# Patient Record
Sex: Female | Born: 1970 | Race: Black or African American | Hispanic: No | Marital: Single | Smoking: Never smoker
Health system: Southern US, Community
[De-identification: ages and names within clinical notes are randomized; demographics above are authoritative.]

## PROBLEM LIST (undated history)

## (undated) DIAGNOSIS — Q039 Congenital hydrocephalus, unspecified: Secondary | ICD-10-CM

## (undated) DIAGNOSIS — D649 Anemia, unspecified: Secondary | ICD-10-CM

## (undated) HISTORY — PX: HAND SURGERY: SHX662

## (undated) HISTORY — DX: Congenital hydrocephalus, unspecified: Q03.9

---

## 1998-02-22 HISTORY — PX: MYOMECTOMY: SHX85

## 1998-07-27 ENCOUNTER — Emergency Department (HOSPITAL_COMMUNITY): Admission: EM | Admit: 1998-07-27 | Discharge: 1998-07-27 | Payer: Self-pay | Admitting: Emergency Medicine

## 1999-01-22 ENCOUNTER — Emergency Department (HOSPITAL_COMMUNITY): Admission: EM | Admit: 1999-01-22 | Discharge: 1999-01-22 | Payer: Self-pay | Admitting: *Deleted

## 1999-04-05 ENCOUNTER — Emergency Department (HOSPITAL_COMMUNITY): Admission: EM | Admit: 1999-04-05 | Discharge: 1999-04-05 | Payer: Self-pay | Admitting: Emergency Medicine

## 2000-04-11 ENCOUNTER — Encounter: Payer: Self-pay | Admitting: Obstetrics and Gynecology

## 2000-04-11 ENCOUNTER — Ambulatory Visit (HOSPITAL_COMMUNITY): Admission: RE | Admit: 2000-04-11 | Discharge: 2000-04-11 | Payer: Self-pay | Admitting: Obstetrics and Gynecology

## 2000-10-25 ENCOUNTER — Emergency Department (HOSPITAL_COMMUNITY): Admission: EM | Admit: 2000-10-25 | Discharge: 2000-10-25 | Payer: Self-pay | Admitting: Emergency Medicine

## 2000-10-25 ENCOUNTER — Encounter: Payer: Self-pay | Admitting: Emergency Medicine

## 2001-02-16 ENCOUNTER — Encounter: Payer: Self-pay | Admitting: Emergency Medicine

## 2001-02-16 ENCOUNTER — Emergency Department (HOSPITAL_COMMUNITY): Admission: EM | Admit: 2001-02-16 | Discharge: 2001-02-16 | Payer: Self-pay | Admitting: Emergency Medicine

## 2001-06-07 ENCOUNTER — Emergency Department (HOSPITAL_COMMUNITY): Admission: EM | Admit: 2001-06-07 | Discharge: 2001-06-07 | Payer: Self-pay | Admitting: Emergency Medicine

## 2001-06-07 ENCOUNTER — Encounter: Payer: Self-pay | Admitting: Emergency Medicine

## 2002-01-22 ENCOUNTER — Encounter (INDEPENDENT_AMBULATORY_CARE_PROVIDER_SITE_OTHER): Payer: Self-pay | Admitting: Specialist

## 2002-01-22 ENCOUNTER — Inpatient Hospital Stay (HOSPITAL_COMMUNITY): Admission: RE | Admit: 2002-01-22 | Discharge: 2002-01-25 | Payer: Self-pay

## 2003-08-27 ENCOUNTER — Other Ambulatory Visit: Admission: RE | Admit: 2003-08-27 | Discharge: 2003-08-27 | Payer: Self-pay | Admitting: Internal Medicine

## 2006-06-28 ENCOUNTER — Emergency Department (HOSPITAL_COMMUNITY): Admission: EM | Admit: 2006-06-28 | Discharge: 2006-06-28 | Payer: Self-pay | Admitting: Emergency Medicine

## 2007-06-18 ENCOUNTER — Emergency Department (HOSPITAL_COMMUNITY): Admission: EM | Admit: 2007-06-18 | Discharge: 2007-06-18 | Payer: Self-pay | Admitting: Family Medicine

## 2007-11-02 ENCOUNTER — Other Ambulatory Visit: Admission: RE | Admit: 2007-11-02 | Discharge: 2007-11-02 | Payer: Self-pay | Admitting: Gynecology

## 2007-11-02 ENCOUNTER — Ambulatory Visit: Payer: Self-pay | Admitting: Women's Health

## 2007-12-01 ENCOUNTER — Ambulatory Visit: Payer: Self-pay | Admitting: Women's Health

## 2008-01-11 ENCOUNTER — Emergency Department (HOSPITAL_COMMUNITY): Admission: EM | Admit: 2008-01-11 | Discharge: 2008-01-11 | Payer: Self-pay | Admitting: Emergency Medicine

## 2008-02-19 ENCOUNTER — Ambulatory Visit: Payer: Self-pay | Admitting: Women's Health

## 2009-04-24 ENCOUNTER — Emergency Department (HOSPITAL_COMMUNITY): Admission: EM | Admit: 2009-04-24 | Discharge: 2009-04-24 | Payer: Self-pay | Admitting: Family Medicine

## 2010-07-10 NOTE — Op Note (Signed)
NAME:  Carla Riley, Carla Riley                         ACCOUNT NO.:  [id number]   MEDICAL RECORD NO.:  [id number]                   PATIENT TYPE:  INP   LOCATION:  9325                                 FACILITY:  [facility]   PHYSICIAN:  Petra Kuba, M.D.                 DATE OF BIRTH:  10/19/1970   DATE OF PROCEDURE:  02/06/2002  DATE OF DISCHARGE:  01/25/2002                                 OPERATIVE REPORT   PROCEDURE PERFORMED:  Endoscopic retrograde cholangiopancreatography with  sphincterotomy and stone extraction.   INDICATIONS FOR PROCEDURE:  Probable common bile duct stone in a patient  with gallstones, low grade fever, elevated liver tests.  Consent was signed  after the risks, benefits, methods and options were thoroughly discussed by  both myself and Dr. Magnus Ivan.   MEDICINES USED:  Demerol 80 mg, Versed 8 mg.   DESCRIPTION OF PROCEDURE:  The side viewing diagnostic video duodenoscope  was inserted by indirect vision into the stomach, advanced through a normal  pylorus into the duodenal bulb and a bulbous edematous ampulla was brought  into view.  Using the triple lumen sphinctertome, selective deep cannulation  was obtained on the second attempt.  On some of the early films there  appeared to be a small distal stone.  Also on some of the early films, the  cystic duct was partially filled.  The JAG wire was advanced into the  intrahepatic.  The intrahepatics were filled without abnormality.  We went  ahead and proceeded with the customary medium sized  spincterotomy and the  customary position over the JAG wire until the one half to three quarter  bowed sphinctertome was easily able to be advanced in and out of the duct  and excellent drainage was seen.  While we were exchanging the sphinctertome  for the 8.5 mm balloon, a small stone fell out of the duct followed by milky  white bile.  We went ahead and proceeded with three balloon pullthroughs  over the JAG wire without any  further debris or stones and on two occlusion  cholangiograms, no obvious residual stones were seen.  There were no PD  injections.  The procedure was terminated at this juncture.  The scope  removed.  There was good drainage seen at the end of the procedure.  The  patient tolerated the procedure well.  There were no obvious immediate  complication.   ENDOSCOPIC DIAGNOSES:  1. Bulbous ampulla.  2. No pancreatic duct injections.  3. Status post moderate sphincterotomy and stone extraction with some milky     white bile being seen.  4. Status post three 8.5 mm balloon pullthroughs and negative occlusion     cholangiogram without obvious other abnormality.    PLAN:  Customary post ERCP orders.  Continue antibiotics.  Laparoscopic  cholecystectomy p.r.n. or probably tomorrow.  No aspirin or nonsteroidals  for two weeks based on  post sphincterotomy.                                               Petra Kuba, M.D.    MEM/MEDQ  D:  02/06/2002  T:  02/07/2002  Job:  [id number]   cc:   Abigail Miyamoto, M.D.  [address]  [place]  [place] [address]  Fax: [phone]   Schuyler Amor, M.D.  [address]  [place], [place] [address]  Fax: [phone]

## 2010-07-10 NOTE — Op Note (Signed)
NAME:  Carla Riley, Carla Riley                         ACCOUNT NO.:  [id number]   MEDICAL RECORD NO.:  [id number]                   PATIENT TYPE:  INP   LOCATION:  9325                                 [facility]   PHYSICIAN:  Ronda Fairly. Galen Daft, M.D.              DATE OF BIRTH:  12/22/1970   DATE OF PROCEDURE:  01/22/2002  DATE OF DISCHARGE:                                 OPERATIVE REPORT   PREOPERATIVE DIAGNOSIS:  Symptomatic uterine fibroids with bilateral  hydronephrosis.   POSTOPERATIVE DIAGNOSIS:  1. Symptomatic uterine fibroids with bilateral hydronephrosis.  2. Bilateral tubal disease.   PROCEDURE:  Abdominal myomectomy.   SURGEON:  Ronda Fairly. Galen Daft, M.D.   ASSISTANT:  Rudy Jew. Ashley Royalty, M.D.   ANESTHESIA:  General.   ESTIMATED BLOOD LOSS:  1000 cc.   COMPLICATIONS:  None.   SPECIMENS:  Multiple uterine fibroids.   DESCRIPTION OF PROCEDURE:  The patient received preoperative antibiotics.  Informed consent prior to bringing her to the operating room.  All risks of  the procedure, benefits and alternatives were explained.  She had general  anesthesia and a Pfannenstiel incision after a Betadine prep, Foley catheter  and draping.  The abdomen was entered without problem.  The uterus was  enlarged with multiple fibroids.  They were extensive in both their  dimensions and locations.  The largest fibroids were approximately softball  to large baseball size or small grapefruit and the smaller fibroids were 1-3  cm in size.  They are present on the anterior, posterior, cervical, fundal  and required multiple procedures.  The procedure was performed as follows:  The uterus was injected with Vasopressin 20 units and 30 cc of saline when  necessary for a medical tourniquet.  The incision site was first anteriorly.  The anterior fibroid was identified, removed without difficulty.  Hemostasis  was controlled and that site was closed with a combination of Monocryl and  chromic  suture.  The area was completely hemostatic.  A secondary incision  was then on the right horn behind the right tube and this was the largest  fibroid by far.  This fibroid extended to the lateral surface behind the  right tube.  It was removed with careful sharp and blunt dissection, and the  blood supply was controlled.  The incision area on this one entered the  endometrial cavity.  This was closed with chromic suture followed by closure  of the myometrium with the combination of chromic, interrupted and running  sutures.  The third incision was necessary on the left side behind the tube  and this was in the fundal area.  This allowed for removal of the remaining  fibroids.  Through the second incision, three fibroids were removed.  Through the third incision, several more fibroids were removed.  Because of  the oozing throughout, there was a total estimated blood loss of 1000 cc.  Hemostasis was  controlled with sutures and cautery when necessary.  There  was a remaining fibroid approximately 3 cm in size on the posterior cervix.  This was felt to be both too deep in the pelvis, too close to the blood  supply for the uterine vessels and a risk for removal was felt to outweigh  the benefit of this final fibroid at this time.  Also given the amount of  blood loss and the preoperative anemia, the decision was made not to remove  this final fibroid.  There was a small incidental fibroid about 0.25 cm on  the serosal surface also left in situ because of similar lesions and the  amount of blood loss to date to this time was 1000 cc estimated.  The  patient was hemodynamically stable and did not require a transfusion in the  operating room.  The right tube showed evidence of abnormal fimbria  development with a mid area fimbriated portion and a distal fimbriated  portion.  The right ovary was not identifiable and was either  retroperitoneal or absent.  It was palpable retroperitoneal but not  visible  surgically.  The left ovary was unremarkable.  This tube on the left side  was completely plugged on its end.  The tubes themselves were not  significantly dilated.  The bowel was unremarkable.  The bowel was not  markedly adherent anywhere.  The tubes were adherent to the uterus in  various locations, and these were freed up when necessary.  The areas were  checked for hemostasis and there was complete hemostasis noted.  There was  no active bleeding, and all areas were checked with the uterus backing the  abdominal cavity again, and there was complete hemostasis noted.  The  subfascial tissues were all hemostatic, and the fascia was closed with 0  Vicryl in a running fashion from either side and the subcutaneous tissues  were hemostatic.  The skin was closed with staple closure.  All instrument,  sponge and needle counts were correct throughout the case.  The patient left  the operating room in stable condition.  There were no complications.                                               Ronda Fairly. Galen Daft, M.D.    NJT/MEDQ  D:  01/22/2002  T:  01/23/2002  Job:  [id number]

## 2010-07-10 NOTE — Discharge Summary (Signed)
NAME:  Carla Riley, RORRER                         ACCOUNT NO.:  [id number]   MEDICAL RECORD NO.:  [id number]                   PATIENT TYPE:  INP   LOCATION:  9325                                 [facility]   PHYSICIAN:  Ronda Fairly. Galen Daft, M.D.              DATE OF BIRTH:  12-08-1970   DATE OF ADMISSION:  01/22/2002  DATE OF DISCHARGE:  01/25/2002                                 DISCHARGE SUMMARY   ADMISSION DIAGNOSIS:  Uterine fibroids.   PRINCIPAL DIAGNOSIS:  Uterine fibroids.   SECONDARY DIAGNOSES:  1. Hydronephrosis.  2. Postoperative anemia.   COMPLICATIONS:  None.   CONDITION ON DISCHARGE:  Stable.   FINAL DIAGNOSIS:  Uterine fibroids, status post abdominal myomectomy.   PROCEDURE:  Abdominal myomectomy.   HOSPITAL COURSE:  The patient was admitted on 01/22/02, abdominal myomectomy  was carried out without difficulty.  Total estimated blood loss was 1000 cc.  There were several fibroids removed.  Pathology came back as benign.  A  total weight of fibroid removal was 619 g.  There were 11 masses total  according to the pathology report and consistent with the operative report.  The total area showed that there was some tubal disease present.  The right  tube showed evidence of an abnormal fimbriae development with some mid  portion fimbriated appearing area at the distal fimbriated portion.  The  right ovary was not surgically identified either retroperitoneal or  surgically absent.  The left ovary was unremarkable.  The left tube,  however, had complete closure and phimosis of the end.  There were no  complications noted during the surgical procedure.  The patient left the  operating room in stable condition.  On her first postoperative day she was  doing fairly well.  Her hemoglobin postoperatively was of concern, as she  started out with a preoperative hemoglobin of 10.5.  Her first postoperative  day, her hemoglobin was 6.8 g, and on the day of discharge, on  01/25/02, it  was down to 6 g.  Her white blood cell count was within normal limits, and  the patient was hemodynamically stable with normal blood pressure, no  orthostasis.   DISCHARGE MEDICATIONS:  1. Iron therapy.  2. Percocet for pain.   FOLLOWUP:  In the office in one week for CBC.   DISCHARGE INSTRUCTIONS:  The patient was given full instructions regarding  activity limits, special instructions, including to call if there is any  shortness of breath, chest pain, or dizziness, and activity limits,  medications, wound care, and follow up in the office were stressed with the  patient prior to discharge.   She had full return of bowel function prior to discharge, and she was  ambulatory without difficulty, tolerating a regular diet.  Ronda Fairly. Galen Daft, M.D.    NJT/MEDQ  D:  02/27/2002  T:  02/27/2002  Job:  [id number]   cc:   Thora Lance, M.D.  [address]  [place]  [place] [address]  Fax: [phone]

## 2011-06-09 ENCOUNTER — Emergency Department (HOSPITAL_COMMUNITY): Payer: No Typology Code available for payment source

## 2011-06-09 ENCOUNTER — Encounter (HOSPITAL_COMMUNITY): Payer: Self-pay

## 2011-06-09 ENCOUNTER — Emergency Department (HOSPITAL_COMMUNITY)
Admission: EM | Admit: 2011-06-09 | Discharge: 2011-06-09 | Disposition: A | Discharge status: Home / Self Care | Payer: No Typology Code available for payment source | Attending: Emergency Medicine | Admitting: Emergency Medicine

## 2011-06-09 DIAGNOSIS — S161XXA Strain of muscle, fascia and tendon at neck level, initial encounter: Secondary | ICD-10-CM

## 2011-06-09 DIAGNOSIS — Y92410 Unspecified street and highway as the place of occurrence of the external cause: Secondary | ICD-10-CM | POA: Insufficient documentation

## 2011-06-09 DIAGNOSIS — S139XXA Sprain of joints and ligaments of unspecified parts of neck, initial encounter: Secondary | ICD-10-CM | POA: Insufficient documentation

## 2011-06-09 MED ORDER — OXYCODONE-ACETAMINOPHEN 5-325 MG PO TABS
1.0000 | ORAL_TABLET | Freq: Once | ORAL | Status: AC
  Administered 2011-06-09: 1 via ORAL
  Filled 2011-06-09: qty 1

## 2011-06-09 MED ORDER — HYDROCODONE-ACETAMINOPHEN 5-500 MG PO TABS: 1.0000 | ORAL_TABLET | Freq: Four times a day (QID) | ORAL | Status: AC | PRN

## 2011-06-09 MED ORDER — DIAZEPAM 5 MG PO TABS: 5.0000 mg | ORAL_TABLET | Freq: Three times a day (TID) | ORAL | Status: AC | PRN

## 2011-06-09 MED ORDER — DIAZEPAM 5 MG PO TABS
5.0000 mg | ORAL_TABLET | Freq: Once | ORAL | Status: AC
  Administered 2011-06-09: 5 mg via ORAL
  Filled 2011-06-09: qty 1

## 2011-06-09 MED ORDER — IBUPROFEN 600 MG PO TABS: 600.0000 mg | ORAL_TABLET | Freq: Four times a day (QID) | ORAL | Status: AC | PRN

## 2011-06-09 MED ORDER — IBUPROFEN 800 MG PO TABS
800.0000 mg | ORAL_TABLET | Freq: Once | ORAL | Status: AC
Start: 2011-06-09 — End: 2011-06-09
  Administered 2011-06-09: 800 mg via ORAL
  Filled 2011-06-09: qty 1
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
## 2011-06-09 NOTE — ED Notes (Signed)
PA at bedside.

## 2011-06-09 NOTE — ED Notes (Signed)
Patient transported to X-ray 

## 2011-06-09 NOTE — Discharge Instructions (Signed)
Your x-ray is normal today. I suspect you have a muscular strain in your neck which is causing your pain. Ibuprofen for pain. Vicodin for severe pain, do not drive if taking. Take valium as prescribed as needed for spasms. Try heating pad. Follow up with primary care doctor if pain not improving in 3-5 days.  Cervical Sprain A cervical sprain is an injury in the neck in which the ligaments are stretched or torn. The ligaments are the tissues that hold the bones of the neck (vertebrae) in place.Cervical sprains can range from very mild to very severe. Most cervical sprains get better in 1 to 3 weeks, but it depends on the cause and extent of the injury. Severe cervical sprains can cause the neck vertebrae to be unstable. This can lead to damage of the spinal cord and can result in serious nervous system problems. Your caregiver will determine whether your cervical sprain is mild or severe. CAUSES  Severe cervical sprains may be caused by:  Contact sport injuries (football, rugby, wrestling, hockey, auto racing, gymnastics, diving, martial arts, boxing).   Motor vehicle collisions.   Whiplash injuries. This means the neck is forcefully whipped backward and forward.   Falls.  Mild cervical sprains may be caused by:   Awkward positions, such as cradling a telephone between your ear and shoulder.   Sitting in a chair that does not offer proper support.   Working at a poorly Marketing executive station.   Activities that require looking up or down for long periods of time.  SYMPTOMS   Pain, soreness, stiffness, or a burning sensation in the front, back, or sides of the neck. This discomfort may develop immediately after injury or it may develop slowly and not begin for 24 hours or more after an injury.   Pain or tenderness directly in the middle of the back of the neck.   Shoulder or upper back pain.   Limited ability to move the neck.   Headache.   Dizziness.   Weakness, numbness, or  tingling in the hands or arms.   Muscle spasms.   Difficulty swallowing or chewing.   Tenderness and swelling of the neck.  DIAGNOSIS  Most of the time, your caregiver can diagnose this problem by taking your history and doing a physical exam. Your caregiver will ask about any known problems, such as arthritis in the neck or a previous neck injury. X-rays may be taken to find out if there are any other problems, such as problems with the bones of the neck. However, an X-ray often does not reveal the full extent of a cervical sprain. Other tests such as a computed tomography (CT) scan or magnetic resonance imaging (MRI) may be needed. TREATMENT  Treatment depends on the severity of the cervical sprain. Mild sprains can be treated with rest, keeping the neck in place (immobilization), and pain medicines. Severe cervical sprains need immediate immobilization and an appointment with an orthopedist or neurosurgeon. Several treatment options are available to help with pain, muscle spasms, and other symptoms. Your caregiver may prescribe:  Medicines, such as pain relievers, numbing medicines, or muscle relaxants.   Physical therapy. This can include stretching exercises, strengthening exercises, and posture training. Exercises and improved posture can help stabilize the neck, strengthen muscles, and help stop symptoms from returning.   A neck collar to be worn for short periods of time. Often, these collars are worn for comfort. However, certain collars may be worn to protect the neck and prevent  further worsening of a serious cervical sprain.  HOME CARE INSTRUCTIONS   Put ice on the injured area.   Put ice in a plastic bag.   Place a towel between your skin and the bag.   Leave the ice on for 15 to 20 minutes, 3 to 4 times a day.   Only take over-the-counter or prescription medicines for pain, discomfort, or fever as directed by your caregiver.   Keep all follow-up appointments as directed by  your caregiver.   Keep all physical therapy appointments as directed by your caregiver.   If a neck collar is prescribed, wear it as directed by your caregiver.   Do not drive while wearing a neck collar.   Make any needed adjustments to your work station to promote good posture.   Avoid positions and activities that make your symptoms worse.   Warm up and stretch before being active to help prevent problems.  SEEK MEDICAL CARE IF:   Your pain is not controlled with medicine.   You are unable to decrease your pain medicine over time as planned.   Your activity level is not improving as expected.  SEEK IMMEDIATE MEDICAL CARE IF:   You develop any bleeding, stomach upset, or signs of an allergic reaction to your medicine.   Your symptoms get worse.   You develop new, unexplained symptoms.   You have numbness, tingling, weakness, or paralysis in any part of your body.  MAKE SURE YOU:   Understand these instructions.   Will watch your condition.   Will get help right away if you are not doing well or get worse.  Document Released: 12/06/2006 Document Revised: 01/28/2011 Document Reviewed: 11/11/2010 [facility] Patient Information 2012 [place], [place].
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
## 2011-06-09 NOTE — ED Notes (Signed)
Family at bedside. 

## 2011-06-10 NOTE — ED Provider Notes (Signed)
Medical screening examination/treatment/procedure(s) were performed by non-physician practitioner and as supervising physician I was immediately available for consultation/collaboration.   Lyanne Co, MD 06/10/11 [phone]

## 2011-07-04 ENCOUNTER — Ambulatory Visit (INDEPENDENT_AMBULATORY_CARE_PROVIDER_SITE_OTHER): Payer: BC Managed Care – PPO | Admitting: Family Medicine

## 2011-07-04 VITALS — BP 133/76 | HR 76 | Temp 98.9°F | Resp 16 | Ht 64.5 in | Wt 145.0 lb

## 2011-07-04 DIAGNOSIS — M533 Sacrococcygeal disorders, not elsewhere classified: Secondary | ICD-10-CM

## 2011-07-04 MED ORDER — METHYLPREDNISOLONE 4 MG PO KIT: PACK | ORAL | Status: AC
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
## 2011-07-04 NOTE — Patient Instructions (Signed)
Return Wednesday for recheck.

## 2011-08-20 ENCOUNTER — Telehealth: Payer: Self-pay | Admitting: *Deleted

## 2011-08-20 NOTE — Telephone Encounter (Signed)
Pt called requesting to speak with nancy about uterine fibroids, pt last seen in 2009, spoke with pt and informed office visit would be best.

## 2011-08-23 ENCOUNTER — Encounter: Payer: Self-pay | Admitting: Women's Health

## 2011-08-23 ENCOUNTER — Ambulatory Visit (INDEPENDENT_AMBULATORY_CARE_PROVIDER_SITE_OTHER): Payer: BC Managed Care – PPO | Admitting: Women's Health

## 2011-08-23 ENCOUNTER — Other Ambulatory Visit (HOSPITAL_COMMUNITY)
Admission: RE | Admit: 2011-08-23 | Discharge: 2011-08-23 | Disposition: A | Discharge status: Home / Self Care | Payer: BC Managed Care – PPO | Source: Ambulatory Visit | Attending: Obstetrics and Gynecology | Admitting: Obstetrics and Gynecology

## 2011-08-23 VITALS — BP 130/80 | Ht 63.5 in | Wt 143.0 lb

## 2011-08-23 DIAGNOSIS — Z01419 Encounter for gynecological examination (general) (routine) without abnormal findings: Secondary | ICD-10-CM | POA: Insufficient documentation

## 2011-08-23 DIAGNOSIS — D259 Leiomyoma of uterus, unspecified: Secondary | ICD-10-CM

## 2011-08-23 DIAGNOSIS — D219 Benign neoplasm of connective and other soft tissue, unspecified: Secondary | ICD-10-CM | POA: Insufficient documentation

## 2011-08-23 DIAGNOSIS — Z0271 Encounter for disability determination: Secondary | ICD-10-CM

## 2011-08-23 DIAGNOSIS — Z833 Family history of diabetes mellitus: Secondary | ICD-10-CM

## 2011-08-23 DIAGNOSIS — Q039 Congenital hydrocephalus, unspecified: Secondary | ICD-10-CM | POA: Insufficient documentation

## 2011-08-23 LAB — CBC WITH DIFFERENTIAL/PLATELET
Basophils Absolute: 0 10*3/uL (ref 0.0–0.1)
Eosinophils Absolute: 0 10*3/uL (ref 0.0–0.7)
Lymphocytes Relative: 47 % — ABNORMAL HIGH (ref 12–46)
Lymphs Abs: 3.1 10*3/uL (ref 0.7–4.0)
Neutrophils Relative %: 44 % (ref 43–77)
Platelets: 314 10*3/uL (ref 150–400)
RBC: 4.14 MIL/uL (ref 3.87–5.11)
RDW: 13.5 % (ref 11.5–15.5)
WBC: 6.5 10*3/uL (ref 4.0–10.5)

## 2011-08-23 NOTE — Patient Instructions (Addendum)
Fibroids You have been diagnosed as having a fibroid. Fibroids are smooth muscle lumps (tumors) which can occur any place in a woman's body. They are usually in the womb (uterus). The most common problem (symptom) of fibroids is bleeding. Over time this may cause low red blood cells (anemia). Other symptoms include feelings of pressure and pain in the pelvis. The diagnosis (learning what is wrong) of fibroids is made by physical exam. Sometimes tests such as an ultrasound are used. This is helpful when fibroids are felt around the ovaries and to look for tumors. TREATMENT   Most fibroids do not need surgical or medical treatment. Sometimes a tissue sample (biopsy) of the lining of the uterus is done to rule out cancer. If there is no cancer and only a small amount of bleeding, the problem can be watched.   Hormonal treatment can improve the problem.   When surgery is needed, it can consist of removing the fibroid. Vaginal birth may not be possible after the removal of fibroids. This depends on where they are and the extent of surgery. When pregnancy occurs with fibroids it is usually normal.   Your caregiver can help decide which treatments are best for you.  HOME CARE INSTRUCTIONS   Do not use aspirin as this may increase bleeding problems.   If your periods (menses) are heavy, record the number of pads or tampons used per month. Bring this information to your caregiver. This can help them determine the best treatment for you.  SEEK IMMEDIATE MEDICAL CARE IF:  You have pelvic pain or cramps not controlled with medications, or experience a sudden increase in pain.   You have an increase of pelvic bleeding between and during menses.   You feel lightheaded or have fainting spells.   You develop worsening belly (abdominal) pain.  Document Released: 02/06/2000 Document Revised: 01/28/2011 Document Reviewed: 09/28/2007 [facility] Patient Information 2012 [place], [place].Health Maintenance,  Females A healthy lifestyle and preventative care can promote health and wellness.  Maintain regular health, dental, and eye exams.   Eat a healthy diet. Foods like vegetables, fruits, whole grains, low-fat dairy products, and lean protein foods contain the nutrients you need without too many calories. Decrease your intake of foods high in solid fats, added sugars, and salt. Get information about a proper diet from your caregiver, if necessary.   Regular physical exercise is one of the most important things you can do for your health. Most adults should get at least 150 minutes of moderate-intensity exercise (any activity that increases your heart rate and causes you to sweat) each week. In addition, most adults need muscle-strengthening exercises on 2 or more days a week.    Maintain a healthy weight. The body mass index (BMI) is a screening tool to identify possible weight problems. It provides an estimate of body fat based on height and weight. Your caregiver can help determine your BMI, and can help you achieve or maintain a healthy weight. For adults 20 years and older:   A BMI below 18.5 is considered underweight.   A BMI of 18.5 to 24.9 is normal.   A BMI of 25 to 29.9 is considered overweight.   A BMI of 30 and above is considered obese.   Maintain normal blood lipids and cholesterol by exercising and minimizing your intake of saturated fat. Eat a balanced diet with plenty of fruits and vegetables. Blood tests for lipids and cholesterol should begin at age 57 and be repeated every 5 years.  If your lipid or cholesterol levels are high, you are over 50, or you are a high risk for heart disease, you may need your cholesterol levels checked more frequently.Ongoing high lipid and cholesterol levels should be treated with medicines if diet and exercise are not effective.   If you smoke, find out from your caregiver how to quit. If you do not use tobacco, do not start.   If you are  pregnant, do not drink alcohol. If you are breastfeeding, be very cautious about drinking alcohol. If you are not pregnant and choose to drink alcohol, do not exceed 1 drink per day. One drink is considered to be 12 ounces (355 mL) of beer, 5 ounces (148 mL) of wine, or 1.5 ounces (44 mL) of liquor.   Avoid use of street drugs. Do not share needles with anyone. Ask for help if you need support or instructions about stopping the use of drugs.   High blood pressure causes heart disease and increases the risk of stroke. Blood pressure should be checked at least every 1 to 2 years. Ongoing high blood pressure should be treated with medicines, if weight loss and exercise are not effective.   If you are 48 to 41 years old, ask your caregiver if you should take aspirin to prevent strokes.   Diabetes screening involves taking a blood sample to check your fasting blood sugar level. This should be done once every 3 years, after age 25, if you are within normal weight and without risk factors for diabetes. Testing should be considered at a younger age or be carried out more frequently if you are overweight and have at least 1 risk factor for diabetes.   Breast cancer screening is essential preventative care for women. You should practice "breast self-awareness." This means understanding the normal appearance and feel of your breasts and may include breast self-examination. Any changes detected, no matter how small, should be reported to a caregiver. Women in their 70s and 30s should have a clinical breast exam (CBE) by a caregiver as part of a regular health exam every 1 to 3 years. After age 66, women should have a CBE every year. Starting at age 52, women should consider having a mammogram (breast X-ray) every year. Women who have a family history of breast cancer should talk to their caregiver about genetic screening. Women at a high risk of breast cancer should talk to their caregiver about having an MRI and a  mammogram every year.   The Pap test is a screening test for cervical cancer. Women should have a Pap test starting at age 4. Between ages 54 and 65, Pap tests should be repeated every 2 years. Beginning at age 80, you should have a Pap test every 3 years as long as the past 3 Pap tests have been normal. If you had a hysterectomy for a problem that was not cancer or a condition that could lead to cancer, then you no longer need Pap tests. If you are between ages 66 and 6, and you have had normal Pap tests going back 10 years, you no longer need Pap tests. If you have had past treatment for cervical cancer or a condition that could lead to cancer, you need Pap tests and screening for cancer for at least 20 years after your treatment. If Pap tests have been discontinued, risk factors (such as a new sexual partner) need to be reassessed to determine if screening should be resumed. Some women have medical  problems that increase the chance of getting cervical cancer. In these cases, your caregiver may recommend more frequent screening and Pap tests.   The human papillomavirus (HPV) test is an additional test that may be used for cervical cancer screening. The HPV test looks for the virus that can cause the cell changes on the cervix. The cells collected during the Pap test can be tested for HPV. The HPV test could be used to screen women aged 34 years and older, and should be used in women of any age who have unclear Pap test results. After the age of 54, women should have HPV testing at the same frequency as a Pap test.   Colorectal cancer can be detected and often prevented. Most routine colorectal cancer screening begins at the age of 57 and continues through age 46. However, your caregiver may recommend screening at an earlier age if you have risk factors for colon cancer. On a yearly basis, your caregiver may provide home test kits to check for hidden blood in the stool. Use of a small camera at the end of a  tube, to directly examine the colon (sigmoidoscopy or colonoscopy), can detect the earliest forms of colorectal cancer. Talk to your caregiver about this at age 75, when routine screening begins. Direct examination of the colon should be repeated every 5 to 10 years through age 51, unless early forms of pre-cancerous polyps or small growths are found.   Hepatitis C blood testing is recommended for all people born from [birth year] through 1965 and any individual with known risks for hepatitis C.   Practice safe sex. Use condoms and avoid high-risk sexual practices to reduce the spread of sexually transmitted infections (STIs). Sexually active women aged 42 and younger should be checked for Chlamydia, which is a common sexually transmitted infection. Older women with new or multiple partners should also be tested for Chlamydia. Testing for other STIs is recommended if you are sexually active and at increased risk.   Osteoporosis is a disease in which the bones lose minerals and strength with aging. This can result in serious bone fractures. The risk of osteoporosis can be identified using a bone density scan. Women ages 79 and over and women at risk for fractures or osteoporosis should discuss screening with their caregivers. Ask your caregiver whether you should be taking a calcium supplement or vitamin D to reduce the rate of osteoporosis.   Menopause can be associated with physical symptoms and risks. Hormone replacement therapy is available to decrease symptoms and risks. You should talk to your caregiver about whether hormone replacement therapy is right for you.   Use sunscreen with a sun protection factor (SPF) of 30 or greater. Apply sunscreen liberally and repeatedly throughout the day. You should seek shade when your shadow is shorter than you. Protect yourself by wearing long sleeves, pants, a wide-brimmed hat, and sunglasses year round, whenever you are outdoors.   Notify your caregiver of new moles  or changes in moles, especially if there is a change in shape or color. Also notify your caregiver if a mole is larger than the size of a pencil eraser.   Stay current with your immunizations.  Document Released: 08/24/2010 Document Revised: 01/28/2011 Document Reviewed: 08/24/2010 [facility] Patient Information 2012 [place], [place].

## 2011-08-23 NOTE — Progress Notes (Signed)
Carla Riley 1970-03-03 [id number]    History:    The patient presents for annual exam. Monthly cycles/5-7 days/no contraception, desires pregnancy, infertility for several years. Same partner X 2 years, reports negative STD screening in partner. No mammogram. Last Pap 2009-hx normal Paps. Hx uterine fibroids with myomectomy in 2000. [place] 2009: 5 uterine fibroids, largest 52X40 mm. Reports urinary frequency for several years, denies dysuria or hematuria.   Past medical history, past surgical history, family history and social history were all reviewed and documented in the EPIC chart. Works at [employer]. Job loss 2010-2012, unable to follow up on fibroids in 2009 due to finances. Boyfriend-3 children, 18.15, and 7 yo. Rubella immune. Hx hydrocephalus as infant with shunt.   ROS:  A  ROS was performed and pertinent positives and negatives are included in the history.  Exam:  Filed Vitals:   08/23/11 1401  BP: 130/80    General appearance:  Normal Head/Neck:  Normal, without cervical or supraclavicular adenopathy. Thyroid:  Symmetrical, normal in size, without palpable masses or nodularity. Respiratory  Effort:  Normal  Auscultation:  Clear without wheezing or rhonchi Cardiovascular  Auscultation:  Regular rate, without rubs, murmurs or gallops  Edema/varicosities:  Not grossly evident Abdominal  Soft,nontender, without masses, guarding or rebound.  Liver/spleen:  No organomegaly noted  Hernia:  None appreciated  Skin  Inspection:  Grossly normal  Palpation:  Grossly normal Neurologic/psychiatric  Orientation:  Normal with appropriate conversation.  Mood/affect:  Normal  Genitourinary    Breasts: Examined lying and sitting.     Right: Without masses, retractions, discharge or axillary adenopathy.     Left: Without masses, retractions, discharge or axillary adenopathy.   Inguinal/mons:  Normal without inguinal adenopathy  External genitalia:  Normal  BUS/Urethra/Skene's  glands:  Normal  Bladder:  Normal  Vagina:  Normal  Cervix:  Normal  Uterus:  18-20 week in size, shape and contour.  Midline and mobile.  Adnexa/parametria:     Rt: Without masses or tenderness.   Lt: Without masses or tenderness.  Anus and perineum: Normal  Digital rectal exam: Normal sphincter tone without palpated masses or tenderness  Assessment/Plan:  41 y.o. DBF G0 for annual exam with complaints of uterine fibroids and urinary frequency.  Enlarged uterus/ fibroids Primary Infertility  Plan: CBC, U/A, Glu. Pap, new screening guidelines reviewed. Sonohysterogram ordered to evaluate uterine fibroids, instructed to schedule with Dr. Lily Peer after next cycle. Discussed treatment options for uterine fibroids, patient desiring pregnancy. SBE's, Ca rich diet, daily MVI, and daily exercise encouraged. Mammogram overdue-instructed to schedule. Discussed fertility awareness for conception.  Harrington Challenger [facility], 2:56 PM 08/23/2011

## 2011-08-24 LAB — URINALYSIS W MICROSCOPIC + REFLEX CULTURE
Casts: NONE SEEN
Glucose, UA: NEGATIVE mg/dL
Hgb urine dipstick: NEGATIVE
Leukocytes, UA: NEGATIVE
pH: 6 (ref 5.0–8.0)

## 2011-08-24 LAB — GLUCOSE, RANDOM: Glucose, Bld: 87 mg/dL (ref 70–99)

## 2011-09-22 ENCOUNTER — Other Ambulatory Visit: Payer: Self-pay | Admitting: Gynecology

## 2011-09-22 ENCOUNTER — Other Ambulatory Visit: Payer: Self-pay | Admitting: Women's Health

## 2011-09-22 ENCOUNTER — Ambulatory Visit (INDEPENDENT_AMBULATORY_CARE_PROVIDER_SITE_OTHER): Payer: BC Managed Care – PPO

## 2011-09-22 ENCOUNTER — Ambulatory Visit (INDEPENDENT_AMBULATORY_CARE_PROVIDER_SITE_OTHER): Payer: BC Managed Care – PPO | Admitting: Gynecology

## 2011-09-22 ENCOUNTER — Encounter: Payer: Self-pay | Admitting: Gynecology

## 2011-09-22 DIAGNOSIS — D219 Benign neoplasm of connective and other soft tissue, unspecified: Secondary | ICD-10-CM

## 2011-09-22 DIAGNOSIS — N946 Dysmenorrhea, unspecified: Secondary | ICD-10-CM | POA: Insufficient documentation

## 2011-09-22 DIAGNOSIS — D259 Leiomyoma of uterus, unspecified: Secondary | ICD-10-CM

## 2011-09-22 DIAGNOSIS — N949 Unspecified condition associated with female genital organs and menstrual cycle: Secondary | ICD-10-CM

## 2011-09-22 DIAGNOSIS — N920 Excessive and frequent menstruation with regular cycle: Secondary | ICD-10-CM

## 2011-09-22 NOTE — Progress Notes (Addendum)
Patient is a 41 year old gravida 0 who presented to the office today for sonohysterogram as a result of her labor myomas uteri and menorrhagia. Patient stated that several years ago she had abdominal myomectomy. She's not sure she wants to maintain her fertility further we'll proceed with definitive surgery. The ultrasound today demonstrated the following:  Uterus is 14.7 cm x 9.7 cm x 10.1 cm endometrial stripe 5.5 mm. Patient had 4 fibroids the largest one measured 56 x 44 mm. Ovaries appeared to be normal.  Sonohysterogram demonstrated no intracavitary defect  Assessment/plan: Patient with symptomatic leiomyomatous uteri recent CBC with hemoglobin 12.4 hematocrit 36.5 platelet count her and 314,000. Literature information was provided on uterine fibroids as well as on hysterectomy. She will discuss with her partner she was to proceed with another abdominal myomectomy versus definitive surgery such as hysterectomy. The risks benefits and pros and cons of both procedure were discussed. Discussed possibly having to donate one or 2 units autologous blood and she decided to proceed with abdominal myomectomy. We'll need to pull her operative note from her previous surgery since patient stated that she was in the hospital for week because of infection. She will contact us and we'll schedule accordingly and I will need to see her for full preoperative examination.

## 2011-09-22 NOTE — Patient Instructions (Addendum)
Hysterectomy Information  A hysterectomy is a procedure where your uterus is surgically removed. It will no longer be possible to have menstrual periods or to become pregnant. The tubes and ovaries can be removed (bilateral salpingo-oopherectomy) during this surgery as well.  REASONS FOR A HYSTERECTOMY  Persistent, abnormal bleeding.   Lasting (chronic) pelvic pain or infection.   The lining of the uterus (endometrium) starts growing outside the uterus (endometriosis).   The endometrium starts growing in the muscle of the uterus (adenomyosis).   The uterus falls down into the vagina (pelvic organ prolapse).   Symptomatic uterine fibroids.   Precancerous cells.   Cervical cancer or uterine cancer.  TYPES OF HYSTERECTOMIES  Supracervical hysterectomy. This type removes the top part of the uterus, but not the cervix.   Total hysterectomy. This type removes the uterus and cervix.   Radical hysterectomy. This type removes the uterus, cervix, and the fibrous tissue that holds the uterus in place in the pelvis (parametrium).  WAYS A HYSTERECTOMY CAN BE PERFORMED  Abdominal hysterectomy. A large surgical cut (incision) is made in the abdomen. The uterus is removed through this incision.   Vaginal hysterectomy. An incision is made in the vagina. The uterus is removed through this incision. There are no abdominal incisions.   Conventional laparoscopic hysterectomy. A thin, lighted tube with a camera (laparoscope) is inserted into 3 or 4 small incisions in the abdomen. The uterus is cut into small pieces. The small pieces are removed through the incisions, or they are removed through the vagina.   Laparoscopic assisted vaginal hysterectomy (LAVH). Three or four small incisions are made in the abdomen. Part of the surgery is performed laparoscopically and part vaginally. The uterus is removed through the vagina.   Robot-assisted laparoscopic hysterectomy. A laparoscope is inserted into 3 or 4  small incisions in the abdomen. A computer-controlled device is used to give the surgeon a 3D image. This allows for more precise movements of surgical instruments. The uterus is cut into small pieces and removed through the incisions or removed through the vagina.  RISKS OF HYSTERECTOMY   Bleeding and risk of blood transfusion. Tell your caregiver if you do not want to receive any blood products.   Blood clots in the legs or lung.   Infection.   Injury to surrounding organs.   Anesthesia problems or side effects.   Conversion to an abdominal hysterectomy.  WHAT TO EXPECT AFTER A HYSTERECTOMY  You will be given pain medicine.   You will need to have someone with you for the first 3 to 5 days after you go home.   You will need to follow up with your surgeon in 2 to 4 weeks after surgery to evaluate your progress.   You may have early menopause symptoms like hot flashes, night sweats, and insomnia.   If you had a hysterectomy for a problem that was not a cancer or a condition that could lead to cancer, then you no longer need Pap tests. However, even if you no longer need a Pap test, a regular exam is a good idea to make sure no other problems are starting.  Document Released: 08/04/2000 Document Revised: 01/28/2011 Document Reviewed: 09/19/2010 [facility] Patient Information 2012 [place], [place].  Fibroids You have been diagnosed as having a fibroid. Fibroids are smooth muscle lumps (tumors) which can occur any place in a woman's body. They are usually in the womb (uterus). The most common problem (symptom) of fibroids is bleeding. Over time this  may cause low red blood cells (anemia). Other symptoms include feelings of pressure and pain in the pelvis. The diagnosis (learning what is wrong) of fibroids is made by physical exam. Sometimes tests such as an ultrasound are used. This is helpful when fibroids are felt around the ovaries and to look for tumors. TREATMENT   Most fibroids do not  need surgical or medical treatment. Sometimes a tissue sample (biopsy) of the lining of the uterus is done to rule out cancer. If there is no cancer and only a small amount of bleeding, the problem can be watched.   Hormonal treatment can improve the problem.   When surgery is needed, it can consist of removing the fibroid. Vaginal birth may not be possible after the removal of fibroids. This depends on where they are and the extent of surgery. When pregnancy occurs with fibroids it is usually normal.   Your caregiver can help decide which treatments are best for you.  HOME CARE INSTRUCTIONS   Do not use aspirin as this may increase bleeding problems.   If your periods (menses) are heavy, record the number of pads or tampons used per month. Bring this information to your caregiver. This can help them determine the best treatment for you.  SEEK IMMEDIATE MEDICAL CARE IF:  You have pelvic pain or cramps not controlled with medications, or experience a sudden increase in pain.   You have an increase of pelvic bleeding between and during menses.   You feel lightheaded or have fainting spells.   You develop worsening belly (abdominal) pain.  Document Released: 02/06/2000 Document Revised: 01/28/2011 Document Reviewed: 09/28/2007 [facility] Patient Information 2012 [place], [place].

## 2011-09-24 ENCOUNTER — Telehealth: Payer: Self-pay | Admitting: *Deleted

## 2011-09-24 NOTE — Telephone Encounter (Signed)
Telephone call, reviewed ultrasound with numerous fibroids largest being about 6 cm. Discussed myomectomy versus hysterectomy. Has not had a pregnancy. Reviewed decreased fertility rates if the increased risks for health after age 41. Will discuss further with boyfriend make a decision whether to proceed with myomectomy versus hysterectomy and will call back.

## 2011-09-24 NOTE — Telephone Encounter (Signed)
Pt called requesting if you could call her @ [phone], she would like to speak with you about her procedure and SHGM. Please advise

## 2011-09-28 ENCOUNTER — Telehealth: Payer: Self-pay | Admitting: *Deleted

## 2011-09-28 NOTE — Telephone Encounter (Signed)
Patient called and wants to speak to you only.  Carla Riley it had to do with "what you all talked about before".  Please call patient.

## 2011-09-29 NOTE — Telephone Encounter (Signed)
Telephone call, states would like to schedule hysterectomy for numerous fibroids, had been contemplating myomectomy vs hysterectomy. Has discussed with Dr. Lily Peer, options were reviewed, is ready to proceed.  Carla Riley please schedule vaginal hysterectomy with Dr. Lily Peer, office visit prior. Anytime is fine, patient says sooner the better.

## 2011-09-29 NOTE — Telephone Encounter (Signed)
Dr. Glenetta Hew I would like to get surgery instructions from you...ie. Procedure, OR time needed, special equipment, etc. Thanks!

## 2011-09-29 NOTE — Telephone Encounter (Signed)
Carla Riley I need to see this patient to do a full exam on her once again under she was here July 31. Because of the size of the uterus we may want to proceed with total laparoscopic hysterectomy but possible abdominal hysterectomy with ovarian conservation. If we started 7:30 in the morning my assistant should be back by 10 AM and I will be back by 10:30 AM.

## 2011-09-30 ENCOUNTER — Other Ambulatory Visit: Payer: Self-pay | Admitting: Gynecology

## 2011-09-30 NOTE — Telephone Encounter (Signed)
Patient will be seen in office on 10/04/11 per Olegario Messier

## 2011-10-04 ENCOUNTER — Encounter: Payer: Self-pay | Admitting: Gynecology

## 2011-10-04 ENCOUNTER — Ambulatory Visit (INDEPENDENT_AMBULATORY_CARE_PROVIDER_SITE_OTHER): Payer: BC Managed Care – PPO | Admitting: Gynecology

## 2011-10-04 VITALS — BP 110/70

## 2011-10-04 DIAGNOSIS — N949 Unspecified condition associated with female genital organs and menstrual cycle: Secondary | ICD-10-CM

## 2011-10-04 DIAGNOSIS — D219 Benign neoplasm of connective and other soft tissue, unspecified: Secondary | ICD-10-CM

## 2011-10-04 DIAGNOSIS — D259 Leiomyoma of uterus, unspecified: Secondary | ICD-10-CM

## 2011-10-04 DIAGNOSIS — R102 Pelvic and perineal pain: Secondary | ICD-10-CM

## 2011-10-04 DIAGNOSIS — Z01818 Encounter for other preprocedural examination: Secondary | ICD-10-CM

## 2011-10-04 DIAGNOSIS — N946 Dysmenorrhea, unspecified: Secondary | ICD-10-CM

## 2011-10-04 DIAGNOSIS — N920 Excessive and frequent menstruation with regular cycle: Secondary | ICD-10-CM

## 2011-10-04 NOTE — Progress Notes (Addendum)
Carla Riley is an 41 y.o. female gravida 0 para 0 that presented to the office today for preoperative consultation. Patient with long-standing history of leiomyomatous uteri. Patient had stated several years ago she had an abdominal myomectomy. She suffers from dysmenorrhea and menorrhagia and has decided to proceed with definitive surgery such as with hysterectomy with ovarian conservation. She is sure that fertility is no longer an issue for her but her quality of life is. Patient had an ultrasound done here in the office on 09/22/2011 with the following result:  Uterus is 14.7 cm x 9.7 cm x 10.1 cm endometrial stripe 5.5 mm. Patient had 4 fibroids the largest one measured 56 x 44 mm. Ovaries appeared to be normal.  Sonohysterogram demonstrated no intracavitary defect  Patient's recent hemoglobin was 12.4 hematocrit was 36.5 with a platelet count 314,000. Patient had a normal Pap smear this year.  History of hydrocephalus as a child and had a shunt placed  Pertinent Gynecological History: Menses: Menorrhagia and dysmenorrhea Bleeding: Dysmenorrhea and menorrhagia Contraception: condoms DES exposure: denies Blood transfusions: none Sexually transmitted diseases: no past history Previous GYN Procedures: Abdominal myomectomy  Last mammogram: No prior study Date: No prior study Last pap: normal Date: 2013 OB History: G 0, P 0   Menstrual History: Menarche age: 12 Patient's last menstrual period was 09/23/2011.    Past Medical History  Diagnosis Date  . Fibroids   . Hydrocephalus in newborn     Past Surgical History  Procedure Date  . Hand surgery   . Myomectomy 2000    Family History  Problem Relation Age of Onset  . Hypertension Father   . Hypertension Maternal Grandmother     Social History:  reports that she has never smoked. She has never used smokeless tobacco. She reports that she drinks alcohol. She reports that she does not use illicit drugs.  Allergies: No  Known Allergies   (Not in a hospital admission)  REVIEW OF SYSTEMS: A ROS was performed and pertinent positives and negatives are included in the history.  GENERAL: No fevers or chills. HEENT: No change in vision, no earache, sore throat or sinus congestion. NECK: No pain or stiffness. CARDIOVASCULAR: No chest pain or pressure. No palpitations. PULMONARY: No shortness of breath, cough or wheeze. GASTROINTESTINAL: No abdominal pain, nausea, vomiting or diarrhea, melena or bright red blood per rectum. GENITOURINARY: No urinary frequency, urgency, hesitancy or dysuria. MUSCULOSKELETAL: No joint or muscle pain, no back pain, no recent trauma. DERMATOLOGIC: No rash, no itching, no lesions. ENDOCRINE: No polyuria, polydipsia, no heat or cold intolerance. No recent change in weight. HEMATOLOGICAL: No anemia or easy bruising or bleeding. NEUROLOGIC: No headache, seizures, numbness, tingling or weakness. PSYCHIATRIC: No depression, no loss of interest in normal activity or change in sleep pattern.     Blood pressure 110/70, last menstrual period 09/23/2011.  Physical Exam:  HEENT:unremarkable Neck:Supple, midline, no thyroid megaly, no carotid bruits Lungs:  Clear to auscultation no rhonchi's or wheezes Heart:Regular rate and rhythm, no murmurs or gallops Breast Exam: Symmetrical with no palpable masses or tenderness Abdomen: Soft nontender no rebound uterine fundus felt up to 3 finger breaths above the symphysis pubis  Pelvic:BUS within normal limits  Vagina: No lesions or discharge Cervix: No lesions or discharge Uterus: 14 week size Adnexa: Difficult to assess adnexa on bimanual exam see ultrasound report above Extremities: No cords, no edema Rectal: Unremarkable  No results found for this or any previous visit (from the past 24 hour(s)).    No results found.  Assessment/Plan: Patient with 14 week size symptomatic leiomyomatous uteri contributing to pelvic pain dysmenorrhea and menorrhagia.  Patient has decided that she would like to proceed with definitive surgery and bypass her options for future fertility. Literature information had previously been provided on total laparoscopic hysterectomy as well as of fibroid uterus. We discussed about proceeding with a total laparoscopic hysterectomy and ovarian conservation. We discussed that in the event of technical difficulty and the operations not being able to be completed laparoscopically that an open laparotomy technique may need to be utilized which may require her to stay in the hospital for 2 days. She fully understands that she will not be able having children in the future. She fully accepts this free willingly. The following were the risk of discussed:                        Patient was counseled as to the risk of surgery to include the following:  1. Infection (prohylactic antibiotics will be administered)  2. DVT/Pulmonary Embolism (prophylactic pneumo compression stockings will be used)  3.Trauma to internal organs requiring additional surgical procedure to repair any injury to     Internal organs requiring perhaps additional hospitalization days.  4.Hemmorhage requiring transfusion and blood products which carry risks such as             anaphylactic reaction, hepatitis and AIDS  Patient had received literature information on the procedure scheduled and all her questions were answered  and accepts all risk.  FERNANDEZ,JUAN HMD5:19 PMTD@   FERNANDEZ,JUAN H 10/04/2011, 5:07 PM   

## 2011-10-04 NOTE — Patient Instructions (Addendum)
Total Laparoscopic Hysterectomy A total laparoscopic hysterectomy is a minimally invasive surgery to remove your uterus and cervix. This surgery is performed by making several small cuts (incisions) in your abdomen. It can also be done with a thin, lighted tube (laparoscope) inserted into 2 small incisions in the lower abdomen. Your fallopian tubes and ovaries can be removed (bilateral salpingo-oopherectomy) during this surgery as well.If a total laparoscopic hysterectomy is started and it is not safe to continue, the laparoscopic surgery will be converted to an open abdominal surgery. You will not have menstrual periods or be able to get pregnant after having this surgery. If a bilateral salpingo-oopherectomy was performed before menopause, you will go through a sudden (abrupt) menopause. This can be helped with hormone medicines. Benefits of minimally invasive surgery include:  Less pain.   Less risk of blood loss.   Less risk of infection.   Quicker return to normal activities.   Usually a 1 night stay in the hospital.   Overall patient satisfaction.  LET YOUR CAREGIVER KNOW ABOUT:  Any history of abnormal Pap tests.   Allergies to food or medicine.   Medicines taken, including vitamins, herbs, eyedrops, over-the-counter medicines, and creams.   Use of steroids (by mouth or creams).   Previous problems with anesthetics or numbing medicines.   History of bleeding problems or blood clots.   Previous surgery.   Other health problems, including diabetes and kidney problems.   Desire for future fertility.   Any infections or colds you may have developed.   Symptoms of irregular or heavy periods, weight loss, or urinary or bowel changes.  RISKS AND COMPLICATIONS   Bleeding.   Blood clots in the legs or lung.   Infection.   Injury to surrounding organs.   Problems with anesthesia.   Early menopause symptoms (hot flashes, night sweats, insomnia).   Risk of conversion  to an open abdominal incision.  BEFORE THE PROCEDURE  Ask your caregiver about changing or stopping your regular medicines.   Do not take aspirin or blood thinners (anticoagulants) for 1 week before the surgery, or as told by your caregiver.   Do not eat or drink anything for 8 hours before the surgery, or as told by your caregiver.   Quit smoking if you smoke.   Arrange for a ride home after surgery and for someone to help you at home during recovery.  PROCEDURE   You will be given antibiotic medicine.   An intravenous (IV) line will be placed in your arm. You will be given medicine to make you sleep (general anesthetic).   A gas (carbon dioxide) will be used to inflate your abdomen. This will allow your surgeon to look inside your abdomen, perform your surgery, and treat any other problems found if necessary.   Three or four small incisions (often less than  inch) will be made in your abdomen. One of these incisions will be made in the area of your belly button (navel). The laparoscope will be inserted into the incision. Your surgeon will look through the laparoscope while doing your procedure.   Other surgical instruments will be inserted through the other incisions.   The uterus may be removed through the vagina or cut into small pieces and removed through the small incisions.   Your incisions will be closed.  AFTER THE PROCEDURE  The gas will be released from inside your abdomen.   You will be taken to the recovery area where a nurse will watch and   check your progress. Once you are awake, stable, and taking fluids well, without other problems, you will return to your room or be allowed to go home.   There is usually minimal discomfort following the surgery because the incisions are so small.   You will be given pain medicine while you are in the hospital and for when you go home.   Try to have someone with you the first 3 to 5 days after you go home.   Follow up with  your surgeon in 2 to 4 weeks after surgery to evaluate your progress.  Document Released: 12/06/2006 Document Revised: 01/28/2011 Document Reviewed: 09/25/2010 [employer] Patient Information 2012 [employer]. 

## 2011-10-06 ENCOUNTER — Telehealth: Payer: Self-pay | Admitting: Gynecology

## 2011-10-06 NOTE — Telephone Encounter (Signed)
Patient called with several surgery related questions.  1. What if she is on her period at time of surgery. It is irregular and hard to predict.  I told her this would not present a problem.  2.  She said Dr. Glenetta Hew explained three little incisions with TLH. She wondered how he will get the fibroid out through the tiny incision.  I explained to her that he uses equipment that allows him to cut the fibroid up into small pieces and remove it through the scope.  Patient understood.  We discussed if she needs assistance with disability/FMLA forms for work that she will let me know.

## 2011-10-18 ENCOUNTER — Encounter (HOSPITAL_COMMUNITY): Payer: Self-pay | Admitting: Pharmacist

## 2011-10-28 ENCOUNTER — Encounter (HOSPITAL_COMMUNITY): Payer: Self-pay

## 2011-10-28 ENCOUNTER — Encounter (HOSPITAL_COMMUNITY)
Admission: RE | Admit: 2011-10-28 | Discharge: 2011-10-28 | Disposition: A | Discharge status: Home / Self Care | Payer: BC Managed Care – PPO | Source: Ambulatory Visit | Attending: Gynecology | Admitting: Gynecology

## 2011-10-28 HISTORY — DX: Anemia, unspecified: D64.9

## 2011-10-28 LAB — URINALYSIS, ROUTINE W REFLEX MICROSCOPIC
Ketones, ur: NEGATIVE mg/dL
Leukocytes, UA: NEGATIVE
Nitrite: NEGATIVE
Specific Gravity, Urine: 1.02 (ref 1.005–1.030)
pH: 7 (ref 5.0–8.0)

## 2011-10-28 LAB — URINE MICROSCOPIC-ADD ON

## 2011-10-28 LAB — SURGICAL PCR SCREEN: Staphylococcus aureus: POSITIVE — AB

## 2011-10-28 LAB — CBC
Platelets: 275 10*3/uL (ref 150–400)
RBC: 4.13 MIL/uL (ref 3.87–5.11)
WBC: 7.6 10*3/uL (ref 4.0–10.5)

## 2011-10-28 NOTE — Patient Instructions (Addendum)
20 Carla Riley  10/28/2011   Your procedure is scheduled on:  11/01/11  Enter through the Main Entrance of [facility] at 6 AM.  Pick up the phone at the desk and dial [phone].   Call this number if you have problems the morning of surgery: [phone]   Remember:   Do not eat food:After Midnight.  Do not drink clear liquids: After Midnight.  Take these medicines the morning of surgery with A SIP OF WATER: NA   Do not wear jewelry, make-up or nail polish.  Do not wear lotions, powders, or perfumes. You may wear deodorant.  Do not shave 48 hours prior to surgery.  Do not bring valuables to the hospital.  Contacts, dentures or bridgework may not be worn into surgery.  Leave suitcase in the car. After surgery it may be brought to your room.  For patients admitted to the hospital, checkout time is 11:00 AM the day of discharge.   Patients discharged the day of surgery will not be allowed to drive home.  Name and phone number of your driver: NA  Special Instructions: CHG Shower Use Special Wash: 1/2 bottle night before surgery and 1/2 bottle morning of surgery.   Please read over the following fact sheets that you were given: MRSA Information

## 2011-11-01 ENCOUNTER — Ambulatory Visit (HOSPITAL_COMMUNITY): Payer: BC Managed Care – PPO | Admitting: Anesthesiology

## 2011-11-01 ENCOUNTER — Encounter (HOSPITAL_COMMUNITY): Payer: Self-pay | Admitting: Anesthesiology

## 2011-11-01 ENCOUNTER — Encounter (HOSPITAL_COMMUNITY): Payer: Self-pay

## 2011-11-01 ENCOUNTER — Inpatient Hospital Stay (HOSPITAL_COMMUNITY)
Admission: RE | Admit: 2011-11-01 | Discharge: 2011-11-03 | DRG: 359 | Disposition: A | Discharge status: Home / Self Care | Payer: BC Managed Care – PPO | Source: Ambulatory Visit | Attending: Gynecology | Admitting: Gynecology

## 2011-11-01 ENCOUNTER — Encounter (HOSPITAL_COMMUNITY)
Admission: RE | Disposition: A | Discharge status: Home / Self Care | Payer: Self-pay | Source: Ambulatory Visit | Attending: Gynecology

## 2011-11-01 DIAGNOSIS — N920 Excessive and frequent menstruation with regular cycle: Secondary | ICD-10-CM | POA: Diagnosis present

## 2011-11-01 DIAGNOSIS — D252 Subserosal leiomyoma of uterus: Secondary | ICD-10-CM | POA: Diagnosis present

## 2011-11-01 DIAGNOSIS — D649 Anemia, unspecified: Secondary | ICD-10-CM | POA: Diagnosis present

## 2011-11-01 DIAGNOSIS — D259 Leiomyoma of uterus, unspecified: Secondary | ICD-10-CM

## 2011-11-01 DIAGNOSIS — N731 Chronic parametritis and pelvic cellulitis: Secondary | ICD-10-CM

## 2011-11-01 DIAGNOSIS — Z9889 Other specified postprocedural states: Secondary | ICD-10-CM

## 2011-11-01 DIAGNOSIS — N946 Dysmenorrhea, unspecified: Secondary | ICD-10-CM

## 2011-11-01 DIAGNOSIS — D251 Intramural leiomyoma of uterus: Secondary | ICD-10-CM | POA: Diagnosis present

## 2011-11-01 DIAGNOSIS — D250 Submucous leiomyoma of uterus: Principal | ICD-10-CM | POA: Diagnosis present

## 2011-11-01 DIAGNOSIS — N949 Unspecified condition associated with female genital organs and menstrual cycle: Secondary | ICD-10-CM | POA: Diagnosis present

## 2011-11-01 HISTORY — PX: ABDOMINAL HYSTERECTOMY: SHX81

## 2011-11-01 SURGERY — HYSTERECTOMY, ABDOMINAL
Anesthesia: General | Site: Abdomen | Wound class: Clean Contaminated

## 2011-11-01 MED ORDER — FENTANYL CITRATE 0.05 MG/ML IJ SOLN
INTRAMUSCULAR | Status: AC
  Filled 2011-11-01: qty 5

## 2011-11-01 MED ORDER — HYDROMORPHONE HCL PF 1 MG/ML IJ SOLN
INTRAMUSCULAR | Status: AC
  Filled 2011-11-01: qty 1

## 2011-11-01 MED ORDER — BUPIVACAINE LIPOSOME 1.3 % IJ SUSP
20.0000 mL | Freq: Once | INTRAMUSCULAR | Status: DC
  Filled 2011-11-01: qty 20

## 2011-11-01 MED ORDER — PROPOFOL 10 MG/ML IV EMUL
INTRAVENOUS | Status: AC
  Filled 2011-11-01: qty 20

## 2011-11-01 MED ORDER — DIPHENHYDRAMINE HCL 50 MG/ML IJ SOLN
12.5000 mg | Freq: Four times a day (QID) | INTRAMUSCULAR | Status: DC | PRN
  Administered 2011-11-02: 12.5 mg via INTRAVENOUS
  Filled 2011-11-01: qty 1

## 2011-11-01 MED ORDER — FENTANYL CITRATE 0.05 MG/ML IJ SOLN
INTRAMUSCULAR | Status: DC | PRN
  Administered 2011-11-01: 125 ug via INTRAVENOUS
  Administered 2011-11-01: 200 ug via INTRAVENOUS
  Administered 2011-11-01: 25 ug via INTRAVENOUS

## 2011-11-01 MED ORDER — DIPHENHYDRAMINE HCL 12.5 MG/5ML PO ELIX
12.5000 mg | ORAL_SOLUTION | Freq: Four times a day (QID) | ORAL | Status: DC | PRN
  Administered 2011-11-02: 12.5 mg via ORAL
  Filled 2011-11-01: qty 5

## 2011-11-01 MED ORDER — HYDROMORPHONE HCL PF 1 MG/ML IJ SOLN
INTRAMUSCULAR | Status: DC | PRN
  Administered 2011-11-01: 0.5 mg via INTRAVENOUS

## 2011-11-01 MED ORDER — LIDOCAINE HCL (CARDIAC) 20 MG/ML IV SOLN
INTRAVENOUS | Status: DC | PRN
  Administered 2011-11-01: 60 mg via INTRAVENOUS

## 2011-11-01 MED ORDER — DEXAMETHASONE SODIUM PHOSPHATE 4 MG/ML IJ SOLN
INTRAMUSCULAR | Status: DC | PRN
  Administered 2011-11-01: 10 mg via INTRAVENOUS

## 2011-11-01 MED ORDER — LACTATED RINGERS IV SOLN
INTRAVENOUS | Status: DC
  Administered 2011-11-01 (×4): via INTRAVENOUS

## 2011-11-01 MED ORDER — INDIGOTINDISULFONATE SODIUM 8 MG/ML IJ SOLN
INTRAMUSCULAR | Status: AC
  Filled 2011-11-01: qty 5

## 2011-11-01 MED ORDER — NEOSTIGMINE METHYLSULFATE 1 MG/ML IJ SOLN
INTRAMUSCULAR | Status: AC
  Filled 2011-11-01: qty 10

## 2011-11-01 MED ORDER — BUPIVACAINE HCL (PF) 0.25 % IJ SOLN
INTRAMUSCULAR | Status: DC | PRN
  Administered 2011-11-01: 30 mL

## 2011-11-01 MED ORDER — ROCURONIUM BROMIDE 100 MG/10ML IV SOLN
INTRAVENOUS | Status: DC | PRN
  Administered 2011-11-01: 15 mg via INTRAVENOUS
  Administered 2011-11-01: 5 mg via INTRAVENOUS
  Administered 2011-11-01: 10 mg via INTRAVENOUS
  Administered 2011-11-01: 35 mg via INTRAVENOUS

## 2011-11-01 MED ORDER — LIDOCAINE HCL (CARDIAC) 20 MG/ML IV SOLN
INTRAVENOUS | Status: AC
  Filled 2011-11-01: qty 5

## 2011-11-01 MED ORDER — DEXAMETHASONE SODIUM PHOSPHATE 10 MG/ML IJ SOLN
INTRAMUSCULAR | Status: AC
  Filled 2011-11-01: qty 1

## 2011-11-01 MED ORDER — NALOXONE HCL 0.4 MG/ML IJ SOLN: 0.4000 mg | INTRAMUSCULAR | Status: DC | PRN

## 2011-11-01 MED ORDER — LACTATED RINGERS IV SOLN
INTRAVENOUS | Status: DC
  Administered 2011-11-01 – 2011-11-02 (×3): via INTRAVENOUS

## 2011-11-01 MED ORDER — 0.9 % SODIUM CHLORIDE (POUR BTL) OPTIME
TOPICAL | Status: DC | PRN
  Administered 2011-11-01 (×3): 1000 mL

## 2011-11-01 MED ORDER — OXYCODONE-ACETAMINOPHEN 5-325 MG PO TABS
1.0000 | ORAL_TABLET | Freq: Four times a day (QID) | ORAL | Status: DC | PRN
  Administered 2011-11-02: 1 via ORAL
  Filled 2011-11-01: qty 1

## 2011-11-01 MED ORDER — VASOPRESSIN 20 UNIT/ML IJ SOLN
INTRAVENOUS | Status: DC | PRN
  Administered 2011-11-01: 09:00:00

## 2011-11-01 MED ORDER — ONDANSETRON HCL 4 MG/2ML IJ SOLN
INTRAMUSCULAR | Status: DC | PRN
  Administered 2011-11-01: 4 mg via INTRAVENOUS

## 2011-11-01 MED ORDER — CEFAZOLIN SODIUM-DEXTROSE 2-3 GM-% IV SOLR
INTRAVENOUS | Status: AC
  Filled 2011-11-01: qty 50

## 2011-11-01 MED ORDER — SODIUM CHLORIDE 0.9 % IJ SOLN: 9.0000 mL | INTRAMUSCULAR | Status: DC | PRN

## 2011-11-01 MED ORDER — KETOROLAC TROMETHAMINE 30 MG/ML IJ SOLN
INTRAMUSCULAR | Status: AC
  Filled 2011-11-01: qty 1

## 2011-11-01 MED ORDER — HYDROMORPHONE HCL PF 1 MG/ML IJ SOLN
0.2500 mg | INTRAMUSCULAR | Status: DC | PRN
  Administered 2011-11-01 (×2): 0.5 mg via INTRAVENOUS

## 2011-11-01 MED ORDER — GLYCOPYRROLATE 0.2 MG/ML IJ SOLN
INTRAMUSCULAR | Status: DC | PRN
  Administered 2011-11-01: .6 mg via INTRAVENOUS

## 2011-11-01 MED ORDER — ROCURONIUM BROMIDE 50 MG/5ML IV SOLN
INTRAVENOUS | Status: AC
  Filled 2011-11-01: qty 2

## 2011-11-01 MED ORDER — BUPIVACAINE LIPOSOME 1.3 % IJ SUSP
INTRAMUSCULAR | Status: DC | PRN
  Administered 2011-11-01: 20 mL

## 2011-11-01 MED ORDER — HYDROMORPHONE HCL PF 1 MG/ML IJ SOLN
INTRAMUSCULAR | Status: AC
  Administered 2011-11-01: 0.5 mg via INTRAVENOUS
  Filled 2011-11-01: qty 1

## 2011-11-01 MED ORDER — HYDROMORPHONE 0.3 MG/ML IV SOLN
INTRAVENOUS | Status: DC
  Administered 2011-11-01 (×2): 0.6 mg via INTRAVENOUS
  Administered 2011-11-01: 12:00:00 via INTRAVENOUS
  Administered 2011-11-01 – 2011-11-02 (×2): 0.9 mg via INTRAVENOUS
  Administered 2011-11-02: 1.19 mg via INTRAVENOUS
  Administered 2011-11-02: 8 mL via INTRAVENOUS
  Filled 2011-11-01: qty 25

## 2011-11-01 MED ORDER — BUPIVACAINE HCL (PF) 0.25 % IJ SOLN
INTRAMUSCULAR | Status: AC
  Filled 2011-11-01: qty 30

## 2011-11-01 MED ORDER — GLYCOPYRROLATE 0.2 MG/ML IJ SOLN
INTRAMUSCULAR | Status: AC
  Filled 2011-11-01: qty 1

## 2011-11-01 MED ORDER — PROPOFOL 10 MG/ML IV EMUL
INTRAVENOUS | Status: DC | PRN
  Administered 2011-11-01: 30 mg via INTRAVENOUS
  Administered 2011-11-01: 170 mg via INTRAVENOUS

## 2011-11-01 MED ORDER — MIDAZOLAM HCL 2 MG/2ML IJ SOLN
INTRAMUSCULAR | Status: AC
  Filled 2011-11-01: qty 2

## 2011-11-01 MED ORDER — ONDANSETRON HCL 4 MG/2ML IJ SOLN: 4.0000 mg | Freq: Four times a day (QID) | INTRAMUSCULAR | Status: DC | PRN

## 2011-11-01 MED ORDER — CEFAZOLIN SODIUM-DEXTROSE 2-3 GM-% IV SOLR
2.0000 g | INTRAVENOUS | Status: AC
  Administered 2011-11-01: 2 g via INTRAVENOUS

## 2011-11-01 MED ORDER — MIDAZOLAM HCL 5 MG/5ML IJ SOLN
INTRAMUSCULAR | Status: DC | PRN
  Administered 2011-11-01: 1 mg via INTRAVENOUS

## 2011-11-01 MED ORDER — ONDANSETRON HCL 4 MG/2ML IJ SOLN
INTRAMUSCULAR | Status: AC
  Filled 2011-11-01: qty 2

## 2011-11-01 MED ORDER — KETOROLAC TROMETHAMINE 30 MG/ML IJ SOLN: 15.0000 mg | Freq: Once | INTRAMUSCULAR | Status: DC | PRN

## 2011-11-01 MED ORDER — NEOSTIGMINE METHYLSULFATE 1 MG/ML IJ SOLN
INTRAMUSCULAR | Status: DC | PRN
  Administered 2011-11-01: 3 mg via INTRAVENOUS

## 2011-11-01 SURGICAL SUPPLY — 84 items
BARRIER ADHS 3X4 INTERCEED (GAUZE/BANDAGES/DRESSINGS) IMPLANT
BENZOIN TINCTURE PRP APPL 2/3 (GAUZE/BANDAGES/DRESSINGS) ×3 IMPLANT
BLADE SURG 15 STRL LF C SS BP (BLADE) ×2 IMPLANT
BLADE SURG 15 STRL SS (BLADE) ×1
CABLE HIGH FREQUENCY MONO STRZ (ELECTRODE) IMPLANT
CANISTER SUCTION 2500CC (MISCELLANEOUS) ×3 IMPLANT
CATH KIT ON Q 5IN DUAL SLV (PAIN MANAGEMENT) IMPLANT
CELLS DAT CNTRL 66122 CELL SVR (MISCELLANEOUS) IMPLANT
CLOTH BEACON ORANGE TIMEOUT ST (SAFETY) ×3 IMPLANT
CONT PATH 16OZ SNAP LID 3702 (MISCELLANEOUS) ×3 IMPLANT
COVER MAYO STAND STRL (DRAPES) ×3 IMPLANT
COVER TABLE BACK 60X90 (DRAPES) ×3 IMPLANT
DECANTER SPIKE VIAL GLASS SM (MISCELLANEOUS) ×6 IMPLANT
DERMABOND ADVANCED (GAUZE/BANDAGES/DRESSINGS) ×1
DERMABOND ADVANCED .7 DNX12 (GAUZE/BANDAGES/DRESSINGS) ×2 IMPLANT
DEVICE SUTURE ENDOST 10MM (ENDOMECHANICALS) IMPLANT
DISSECTOR BLUNT TIP ENDO 5MM (MISCELLANEOUS) IMPLANT
DRSG TEGADERM 2.38X2.75 (GAUZE/BANDAGES/DRESSINGS) IMPLANT
DRSG XEROFORM 1X8 (GAUZE/BANDAGES/DRESSINGS) ×3 IMPLANT
ENDOSTITCH 0 SINGLE 48 (SUTURE) IMPLANT
EVACUATOR SMOKE 8.L (FILTER) ×3 IMPLANT
GAUZE SPONGE 4X4 12PLY STRL LF (GAUZE/BANDAGES/DRESSINGS) ×6 IMPLANT
GLOVE BIO SURGEON STRL SZ7.5 (GLOVE) ×3 IMPLANT
GLOVE BIOGEL PI IND STRL 6.5 (GLOVE) IMPLANT
GLOVE BIOGEL PI IND STRL 8 (GLOVE) ×2 IMPLANT
GLOVE BIOGEL PI INDICATOR 6.5 (GLOVE)
GLOVE BIOGEL PI INDICATOR 8 (GLOVE) ×1
GLOVE ECLIPSE 7.5 STRL STRAW (GLOVE) ×6 IMPLANT
GOWN PREVENTION PLUS LG XLONG (DISPOSABLE) ×12 IMPLANT
HEMOSTAT SURGICEL 2X3 (HEMOSTASIS) ×3 IMPLANT
NEEDLE HYPO 25X1 1.5 SAFETY (NEEDLE) ×6 IMPLANT
NS IRRIG 1000ML POUR BTL (IV SOLUTION) ×3 IMPLANT
OCCLUDER COLPOPNEUMO (BALLOONS) ×3 IMPLANT
PACK ABDOMINAL GYN (CUSTOM PROCEDURE TRAY) IMPLANT
PACK LAVH (CUSTOM PROCEDURE TRAY) ×3 IMPLANT
PAD OB MATERNITY 4.3X12.25 (PERSONAL CARE ITEMS) ×3 IMPLANT
PROTECTOR NERVE ULNAR (MISCELLANEOUS) ×6 IMPLANT
RETRACTOR WND ALEXIS 25 LRG (MISCELLANEOUS) IMPLANT
RTRCTR WOUND ALEXIS 18CM MED (MISCELLANEOUS)
RTRCTR WOUND ALEXIS 25CM LRG (MISCELLANEOUS)
SCALPEL HARMONIC ACE (MISCELLANEOUS) IMPLANT
SCISSORS LAP 5X35 DISP (ENDOMECHANICALS) IMPLANT
SET CYSTO W/LG BORE CLAMP LF (SET/KITS/TRAYS/PACK) IMPLANT
SET IRRIG TUBING LAPAROSCOPIC (IRRIGATION / IRRIGATOR) ×3 IMPLANT
SPONGE GAUZE 4X4 12PLY (GAUZE/BANDAGES/DRESSINGS) ×3 IMPLANT
SPONGE LAP 18X18 X RAY DECT (DISPOSABLE) ×9 IMPLANT
STAPLER VISISTAT 35W (STAPLE) ×3 IMPLANT
STRIP CLOSURE SKIN 1/2X4 (GAUZE/BANDAGES/DRESSINGS) ×3 IMPLANT
SUT CHROMIC 3 0 SH 27 (SUTURE) IMPLANT
SUT PLAIN 3 0 PS2 27 (SUTURE) IMPLANT
SUT VIC AB 0 CT1 18XCR BRD8 (SUTURE) ×4 IMPLANT
SUT VIC AB 0 CT1 27 (SUTURE)
SUT VIC AB 0 CT1 27XBRD ANBCTR (SUTURE) IMPLANT
SUT VIC AB 0 CT1 36 (SUTURE) ×6 IMPLANT
SUT VIC AB 0 CT1 8-18 (SUTURE) ×2
SUT VIC AB 1 CT1 18XBRD ANBCTR (SUTURE) IMPLANT
SUT VIC AB 1 CT1 8-18 (SUTURE)
SUT VIC AB 2-0 SH 27 (SUTURE)
SUT VIC AB 2-0 SH 27XBRD (SUTURE) IMPLANT
SUT VIC AB 3-0 CT1 27 (SUTURE) ×2
SUT VIC AB 3-0 CT1 TAPERPNT 27 (SUTURE) ×4 IMPLANT
SUT VIC AB 3-0 SH 27 (SUTURE) ×1
SUT VIC AB 3-0 SH 27X BRD (SUTURE) ×2 IMPLANT
SUT VIC AB 4-0 KS 27 (SUTURE) ×3 IMPLANT
SUT VICRYL 0 TIES 12 18 (SUTURE) ×3 IMPLANT
SUT VICRYL 0 UR6 27IN ABS (SUTURE) ×3 IMPLANT
SUT VICRYL 3 0 BR 18  UND (SUTURE)
SUT VICRYL 3 0 BR 18 UND (SUTURE) IMPLANT
SUT VICRYL RAPIDE 3 0 (SUTURE) ×3 IMPLANT
SYR 50ML LL SCALE MARK (SYRINGE) ×3 IMPLANT
SYR BULB IRRIGATION 50ML (SYRINGE) ×3 IMPLANT
SYR CONTROL 10ML LL (SYRINGE) ×6 IMPLANT
TAPE CLOTH SURG 4X10 WHT LF (GAUZE/BANDAGES/DRESSINGS) ×3 IMPLANT
TIP UTERINE 5.1X6CM LAV DISP (MISCELLANEOUS) IMPLANT
TIP UTERINE 6.7X10CM GRN DISP (MISCELLANEOUS) IMPLANT
TIP UTERINE 6.7X6CM WHT DISP (MISCELLANEOUS) IMPLANT
TIP UTERINE 6.7X8CM BLUE DISP (MISCELLANEOUS) IMPLANT
TOWEL OR 17X24 6PK STRL BLUE (TOWEL DISPOSABLE) ×6 IMPLANT
TRAY FOLEY CATH 14FR (SET/KITS/TRAYS/PACK) ×3 IMPLANT
TROCAR BALLN 12MMX100 BLUNT (TROCAR) IMPLANT
TROCAR XCEL NON-BLD 11X100MML (ENDOMECHANICALS) ×6 IMPLANT
TROCAR XCEL NON-BLD 5MMX100MML (ENDOMECHANICALS) ×3 IMPLANT
WARMER LAPAROSCOPE (MISCELLANEOUS) ×3 IMPLANT
WATER STERILE IRR 1000ML POUR (IV SOLUTION) IMPLANT

## 2011-11-01 NOTE — Transfer of Care (Signed)
Immediate Anesthesia Transfer of Care Note  Patient: Carla Riley  Procedure(s) Performed: Procedure(s) (LRB) with comments: HYSTERECTOMY ABDOMINAL (N/A)  Patient Location: PACU  Anesthesia Type: General  Level of Consciousness: awake and oriented  Airway & Oxygen Therapy: Patient Spontanous Breathing and Patient connected to nasal cannula oxygen  Post-op Assessment: Report given to PACU RN and Post -op Vital signs reviewed and stable  Post vital signs: Reviewed and stable  Complications: No apparent anesthesia complications

## 2011-11-01 NOTE — Interval H&P Note (Signed)
History and Physical Interval Note:  11/01/2011 7:00 AM  Carla Riley  has presented today for surgery, with the diagnosis of fibroids  The various methods of treatment have been discussed with the patient and family. After consideration of risks, benefits and other options for treatment, the patient has consented to  Procedure(s) (LRB) with comments: HYSTERECTOMY TOTAL LAPAROSCOPIC (N/A) -    HYSTERECTOMY ABDOMINAL (N/A) as a surgical intervention .  The patient's history has been reviewed, patient examined, no change in status, stable for surgery.  I have reviewed the patient's chart and labs.  Questions were answered to the patient's satisfaction.     Ok Edwards

## 2011-11-01 NOTE — Op Note (Signed)
11/01/2011  10:20 AM  PATIENT:  Carla Riley  41 y.o. female  PRE-OPERATIVE DIAGNOSIS:  fibroids, dysmenorrhea, menorrhagia, anemia, pelvic pain  POST-OPERATIVE DIAGNOSIS:  Fibroids, dysmenorrhea, menorrhagia, anemia, pelvic pain, pelvic adhesions  PROCEDURE:  Procedure(s): HYSTERECTOMY ABDOMINAL lysis of pelvic adhesions  SURGEON:  Surgeon(s): Ok Edwards, MD Dara Lords, MD  ANESTHESIA:   local and general  FINDINGS: 14-16 weeks size multilobulated uterus with subserosal, intramural, intrauterine fibroids normal-appearing ovaries. Pelvic adhesions small bowel omentum adhered to the fundus of the uterus from previous myomectomy site.  DESCRIPTION OF OPERATION:The patient was taken to the operating room where she underwent successful general endotracheal anesthesia. A time out procedure was performed prior to start time and agreed upon by all OR Staff. Confirmation of the correct patient, procedure and site were established. The abdomen and vagina were prepped and draped in usual sterile fashion. A Foley catheter was inserted to monitor urinary output. After the drapes were placed a Pfannenstiel skin incision was made 2 cm above the symphysis pubis adjacent to a previous Pfannenstiel scar and the incision was carried down from the skin down to the subcutaneous tissue down to the rectus fascia were midline nick was made in the fascia was incised in a transverse fashion. The peritoneal cavity was entered cautiously. The O'Connor-O'Sullivan retractors were in place and the patient was placed in Trendelenburg position. It was noted at this time that omental adhesions were present to the fundus of the uterus which required meticulous dissection to free. Once this was accomplished attention was placed to the left the round ligament which was transected and with the surgeon's finger the posterior broad ligament was penetrated and with the use of Heaney clamps hugging the uterus the left  utero-ovarian ligament and proximal tube were transected. The pedicle was secured with a stitch of 0 Vicryl suture followed by free tie of 0 Vicryl suture. Same attempt was undertaken on the contralateral side that required more meticulous dissection due to adhesions and fibroids. At this point Pitressin was infiltrated in the midline of the uterus (Pitressin 10 units in 50 cc of normal saline) for a total of 20 cc in an effort to enucleate the large fibroids to allow better exposure to complete the operation. Meticulously the remaining broad and cardinal ligaments were clamped cut and suture ligated with 0 Vicryl suture to the level of the left vaginal fornix with a curved Haney near the left vaginal fornix. Similar procedure was carried out on the contralateral side after both uterine arteries had been secured with 0 Vicryl suture. With the use of Jorgenson scissors the cervix was excised off the vagina and passed off the operative field. The pelvic cavity was then copiously irrigated with normal saline solution and the angle stitches were secured with a transfixation stitch of 0 Vicryl suture. The remaining cuff was secured with interrupted sutures of 0 Vicryl suture. The pelvic cavity was in copious irrigated with normal saline solution. Due to the friability of the tissue as a result of the extensive adhesions and inflammation SurgiCell   was placed on the right adnexa and vaginal cuff. Sponge count and needle count were correct. The following layers that were closed were infiltrated with Exparel for a total of 20 cc for postop analgesia. The visceral peritoneum was not reapproximated. The rectus fascia was closed with a running stitch of 0 Vicryl suture the subcutaneous tissue was reapproximated with 3-0 Vicryl suture and the skin was reapproximated with 3-0 Vicryl suture on  a Mellody Dance needle. Incision was Steri-Stripped followed by placement of Xeroform gauze and 4 x 4 dressing. The patient was extubated and  transferred to recovery room stable vital signs.    ESTIMATED BLOOD LOSS: 400 cc  Intake/Output Summary (Last 24 hours) at 11/01/11 1020 Last data filed at 11/01/11 1003  Gross per 24 hour  Intake   2900 ml  Output    600 ml  Net   2300 ml     BLOOD ADMINISTERED:none   LOCAL MEDICATIONS USED: Exparel (bupivacaine 1.3%) 20 cc (20 cc diluted in 30 cc of normal saline)  SPECIMEN:  Source of Specimen:  Uterus and cervix  DISPOSITION OF SPECIMEN:  PATHOLOGY  COUNTS:  YES  PLAN OF CARE: Transfer to PACU  [facility] HMD10:20 AMTD@

## 2011-11-01 NOTE — H&P (View-Only) (Signed)
Carla Riley is an 41 y.o. female gravida 0 para 0 that presented to the office today for preoperative consultation. Patient with long-standing history of leiomyomatous uteri. Patient had stated several years ago she had an abdominal myomectomy. She suffers from dysmenorrhea and menorrhagia and has decided to proceed with definitive surgery such as with hysterectomy with ovarian conservation. She is sure that fertility is no longer an issue for her but her quality of life is. Patient had an ultrasound done here in the office on 09/22/2011 with the following result:  Uterus is 14.7 cm x 9.7 cm x 10.1 cm endometrial stripe 5.5 mm. Patient had 4 fibroids the largest one measured 56 x 44 mm. Ovaries appeared to be normal.  Sonohysterogram demonstrated no intracavitary defect  Patient's recent hemoglobin was 12.4 hematocrit was 36.5 with a platelet count 314,000. Patient had a normal Pap smear this year.  History of hydrocephalus as a child and had a shunt placed  Pertinent Gynecological History: Menses: Menorrhagia and dysmenorrhea Bleeding: Dysmenorrhea and menorrhagia Contraception: condoms DES exposure: denies Blood transfusions: none Sexually transmitted diseases: no past history Previous GYN Procedures: Abdominal myomectomy  Last mammogram: No prior study Date: No prior study Last pap: normal Date: 2013 OB History: G 0, P 0   Menstrual History: Menarche age: 74 Patient's last menstrual period was 09/23/2011.    Past Medical History  Diagnosis Date  . Fibroids   . Hydrocephalus in newborn     Past Surgical History  Procedure Date  . Hand surgery   . Myomectomy 2000    Family History  Problem Relation Age of Onset  . Hypertension Father   . Hypertension Maternal Grandmother     Social History:  reports that she has never smoked. She has never used smokeless tobacco. She reports that she drinks alcohol. She reports that she does not use illicit drugs.  Allergies: No  Known Allergies   (Not in a hospital admission)  REVIEW OF SYSTEMS: A ROS was performed and pertinent positives and negatives are included in the history.  GENERAL: No fevers or chills. HEENT: No change in vision, no earache, sore throat or sinus congestion. NECK: No pain or stiffness. CARDIOVASCULAR: No chest pain or pressure. No palpitations. PULMONARY: No shortness of breath, cough or wheeze. GASTROINTESTINAL: No abdominal pain, nausea, vomiting or diarrhea, melena or bright red blood per rectum. GENITOURINARY: No urinary frequency, urgency, hesitancy or dysuria. MUSCULOSKELETAL: No joint or muscle pain, no back pain, no recent trauma. DERMATOLOGIC: No rash, no itching, no lesions. ENDOCRINE: No polyuria, polydipsia, no heat or cold intolerance. No recent change in weight. HEMATOLOGICAL: No anemia or easy bruising or bleeding. NEUROLOGIC: No headache, seizures, numbness, tingling or weakness. PSYCHIATRIC: No depression, no loss of interest in normal activity or change in sleep pattern.     Blood pressure 110/70, last menstrual period 09/23/2011.  Physical Exam:  HEENT:unremarkable Neck:Supple, midline, no thyroid megaly, no carotid bruits Lungs:  Clear to auscultation no rhonchi's or wheezes Heart:Regular rate and rhythm, no murmurs or gallops Breast Exam: Symmetrical with no palpable masses or tenderness Abdomen: Soft nontender no rebound uterine fundus felt up to 3 finger breaths above the symphysis pubis  Pelvic:BUS within normal limits  Vagina: No lesions or discharge Cervix: No lesions or discharge Uterus: 14 week size Adnexa: Difficult to assess adnexa on bimanual exam see ultrasound report above Extremities: No cords, no edema Rectal: Unremarkable  No results found for this or any previous visit (from the past 24 hour(s)).  No results found.  Assessment/Plan: Patient with 14 week size symptomatic leiomyomatous uteri contributing to pelvic pain dysmenorrhea and menorrhagia.  Patient has decided that she would like to proceed with definitive surgery and bypass her options for future fertility. Literature information had previously been provided on total laparoscopic hysterectomy as well as of fibroid uterus. We discussed about proceeding with a total laparoscopic hysterectomy and ovarian conservation. We discussed that in the event of technical difficulty and the operations not being able to be completed laparoscopically that an open laparotomy technique may need to be utilized which may require her to stay in the hospital for 2 days. She fully understands that she will not be able having children in the future. She fully accepts this free willingly. The following were the risk of discussed:                        Patient was counseled as to the risk of surgery to include the following:  1. Infection (prohylactic antibiotics will be administered)  2. DVT/Pulmonary Embolism (prophylactic pneumo compression stockings will be used)  3.Trauma to internal organs requiring additional surgical procedure to repair any injury to     Internal organs requiring perhaps additional hospitalization days.  4.Hemmorhage requiring transfusion and blood products which carry risks such as             anaphylactic reaction, hepatitis and AIDS  Patient had received literature information on the procedure scheduled and all her questions were answered  and accepts all risk.  [facility] HMD5:19 PMTD@   FERNANDEZ,JUAN H 10/04/2011, 5:07 PM

## 2011-11-01 NOTE — Anesthesia Postprocedure Evaluation (Signed)
  Anesthesia Post-op Note  Patient: Carla Riley  Procedure(s) Performed: Procedure(s) (LRB) with comments: HYSTERECTOMY ABDOMINAL (N/A)  Patient Location: PACU  Anesthesia Type: General  Level of Consciousness: awake, alert  and oriented  Airway and Oxygen Therapy: Patient Spontanous Breathing  Post-op Pain: mild  Post-op Assessment: Post-op Vital signs reviewed, Patient's Cardiovascular Status Stable, Respiratory Function Stable, Patent Airway, No signs of Nausea or vomiting and Pain level controlled  Post-op Vital Signs: Reviewed and stable  Complications: No apparent anesthesia complications

## 2011-11-01 NOTE — Anesthesia Preprocedure Evaluation (Signed)
Anesthesia Evaluation  Patient identified by MRN, date of birth, ID band Patient awake    Reviewed: Allergy & Precautions, H&P , NPO status , Patient's Chart, lab work & pertinent test results, reviewed documented beta blocker date and time   History of Anesthesia Complications Negative for: history of anesthetic complications  Airway Mallampati: II TM Distance: >3 FB Neck ROM: full    Dental  (+) Teeth Intact   Pulmonary neg pulmonary ROS,  breath sounds clear to auscultation  Pulmonary exam normal       Cardiovascular Exercise Tolerance: Good negative cardio ROS  Rhythm:regular Rate:Normal     Neuro/Psych 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 negative psych ROS   GI/Hepatic negative GI ROS, Neg liver ROS,   Endo/Other  negative endocrine ROS  Renal/GU negative Renal ROS  Female GU complaint     Musculoskeletal   Abdominal   Peds  Hematology negative hematology ROS (+)   Anesthesia Other Findings   Reproductive/Obstetrics negative OB ROS                           Anesthesia Physical Anesthesia Plan  ASA: I  Anesthesia Plan: General ETT   Post-op Pain Management:    Induction:   Airway Management Planned:   Additional Equipment:   Intra-op Plan:   Post-operative Plan:   Informed Consent: I have reviewed the patients History and Physical, chart, labs and discussed the procedure including the risks, benefits and alternatives for the proposed anesthesia with the patient or authorized representative who has indicated his/her understanding and acceptance.   Dental Advisory Given  Plan Discussed with: CRNA and Surgeon  Anesthesia Plan Comments:         Anesthesia Quick Evaluation

## 2011-11-02 ENCOUNTER — Encounter (HOSPITAL_COMMUNITY): Payer: Self-pay | Admitting: Obstetrics and Gynecology

## 2011-11-02 LAB — CBC
HCT: 26 % — ABNORMAL LOW (ref 36.0–46.0)
Hemoglobin: 8.6 g/dL — ABNORMAL LOW (ref 12.0–15.0)
MCV: 89.3 fL (ref 78.0–100.0)
RDW: 13 % (ref 11.5–15.5)
WBC: 11.7 10*3/uL — ABNORMAL HIGH (ref 4.0–10.5)

## 2011-11-02 NOTE — Anesthesia Postprocedure Evaluation (Signed)
  Anesthesia Post-op Note  Patient: Carla Riley  Procedure(s) Performed: Procedure(s) (LRB) with comments: HYSTERECTOMY ABDOMINAL (N/A)  Patient Location: [facility]  Anesthesia Type: General  Level of Consciousness: sedated  Airway and Oxygen Therapy: Patient Spontanous Breathing  Post-op Pain: mild  Post-op Assessment: Post-op Vital signs reviewed  Post-op Vital Signs: Reviewed and stable  Complications: No apparent anesthesia complications

## 2011-11-02 NOTE — Progress Notes (Signed)
Patient ID: KIANNI LHEUREUX, female   DOB: [birth year], 41 y.o.   MRN: [id number]   Subjective: Patient reports tolerating PO. Patient's Foley catheter was discontinued at 0600 hours this morning and on morning rounds at approximately 7:15 she had not voided spontaneously. She has tolerated clear liquids and has ambulated.  Objective: I have reviewed patient's vital signs.  vital signs, intake and output, medications and labs.  Filed Vitals:   11/02/11 0631  BP:   Pulse:   Temp:   Resp: 16   I/O last 3 completed shifts: In: 5017.5 [P.O.:180; I.V.:4837.5] Out: 3075 [Urine:2675; Blood:400]    Results for orders placed during the hospital encounter of 11/01/11 (from the past 24 hour(s))  CBC     Status: Abnormal   Collection Time   11/02/11  5:25 AM      Component Value Range   WBC 11.7 (*) 4.0 - 10.5 K/uL   RBC 2.91 (*) 3.87 - 5.11 MIL/uL   Hemoglobin 8.6 (*) 12.0 - 15.0 g/dL   HCT 96.2 (*) 95.2 - 84.1 %   MCV 89.3  78.0 - 100.0 fL   MCH 29.6  26.0 - 34.0 pg   MCHC 33.1  30.0 - 36.0 g/dL   RDW 32.4  40.1 - 02.7 %   Platelets 202  150 - 400 K/uL    EXAM General: alert Resp: clear to auscultation bilaterally Cardio: regular rate and rhythm, S1, S2 normal, no murmur, click, rub or gallop GI: soft, non-tender; bowel sounds normal; no masses,  no organomegaly Extremities: extremities normal, atraumatic, no cyanosis or edema Vaginal Bleeding: none Incision intact  Assessment: s/p Procedure(s): HYSTERECTOMY ABDOMINAL: stable, progressing well and tolerating diet. We'll convert to by mouth pain medication discontinue PCA pump. Her diet would be advanced. Pathology report pending.  Plan: Advance diet Encourage ambulation Advance to PO medication Discontinue IV fluids\   LOS: 1 day    Ok Edwards, MD 11/02/2011 9:40 AM    11/02/2011, 9:40 AM

## 2011-11-02 NOTE — Addendum Note (Signed)
Addendum  created 11/02/11 1315 by Algis Greenhouse, CRNA   Modules edited:Notes Section

## 2011-11-02 NOTE — Progress Notes (Signed)
UR Chart review completed.  

## 2011-11-03 ENCOUNTER — Telehealth: Payer: Self-pay | Admitting: *Deleted

## 2011-11-03 ENCOUNTER — Encounter (HOSPITAL_COMMUNITY): Payer: Self-pay | Admitting: Gynecology

## 2011-11-03 MED ORDER — OXYCODONE-ACETAMINOPHEN 5-325 MG PO TABS: 1.0000 | ORAL_TABLET | Freq: Four times a day (QID) | ORAL | Status: AC | PRN

## 2011-11-03 MED ORDER — DOCUSATE SODIUM 50 MG PO CAPS: 50.0000 mg | ORAL_CAPSULE | Freq: Two times a day (BID) | ORAL | Status: AC

## 2011-11-03 MED ORDER — METOCLOPRAMIDE HCL 10 MG PO TABS: 10.0000 mg | ORAL_TABLET | Freq: Three times a day (TID) | ORAL | Status: DC

## 2011-11-03 NOTE — Discharge Summary (Signed)
Physician Discharge Summary  Patient ID: Carla Riley MRN: [id number] DOB/AGE: 41-Nov-1972 41 y.o.  Admit date: 11/01/2011 Discharge date: 11/03/2011  Admission Diagnoses: fibroids, anemia, dysmenorrhea, menorrhagia, pelvic pain   Discharge Diagnoses: Leiomyomatous uteri  Final path report:Diagnosis Uterus and cervix - CERVIX: SQUAMOUS METAPLASIA. - ENDOMETRIUM: SECRETORY. NO EVIDENCE OF HYPERPLASIA OR CARCINOMA. - MYOMETRIUM: LEIOMYOMATA   Active Problems:  * No active hospital problems. *    Discharged Condition: good  Consults:None  Significant Diagnostic Studies: labs: Discharge hemoglobin 8.6 preop hemoglobin 12.1.  Treatments:surgery: Total abdominal hysterectomy with lysis of pelvic adhesions  Filed Vitals:   11/03/11 0514  BP: 123/78  Pulse: 90  Temp: 99.2 F (37.3 C)  Resp: 18         Hospital Course: Patient was admitted to the hospital on the morning of September 9 where she underwent a total abdominal hysterectomy with lysis of pelvic adhesions. Patient had a PCA pump with provided for the first 24 hours which has been discontinued as well as her Foley catheter. Her diet was gradually increased from clear liquid diet to full regular diet. She was up ambulating and tolerating regular diet well and voiding spontaneously. Her postop hemoglobin was 8.6 and patient was not orthostatic and ambulated without any symptoms. Her Pfannenstiel incision was intact the morning of discharge she had passed flatus and her abdomen 4 quadrants of positive bowel sounds. She was rated be discharged home.   Discharge Exam: General appearance: Patient in no acute distress tolerated regular diet was ambulating voiding well and passed gas Lungs: Clear to auscultation Rogers or wheezes Heart: Regular rate and rhythm no murmurs or gallops Abdomen: Soft nontender no rebound or guarding Pfannenstiel incision intact 4 quadrants bowel sounds present Extremities no cords no edema No  vaginal bleeding reported    Significant Diagnostic Studies: labs: See above  Treatments: surgery: Total abdominal hysterectomy with lysis of pelvic adhesions  Disposition: 01-Home or Self Care  Discharge Orders    Future Appointments: Provider: Department: Dept Phone: Center:   11/17/2011 9:30 AM Ok Edwards, MD [facility] [phone] [facility]     Future Orders Please Complete By Expires   Resume previous diet      Driving Restrictions      Comments:   No driving for 1weeks   Lifting restrictions      Comments:   No lifting for 6 weeks   Call MD for:  temperature >100.5      Call MD for:  redness, tenderness, or signs of infection (pain, swelling, bleeding, redness, odor or green/yellow discharge around incision site)      Call MD for:  severe or increased pain, loss or decreased feeling  in affected limb(s)      Discharge instructions      Comments:   Post  op appointment with Dr. Lily Peer  Sept. 25th 9:30 am  Hysterectomy, Abdominal & Vaginal Care After Refer to this sheet in the next few weeks. These discharge instructions provide you with general information on caring for yourself after you leave the hospital. Your caregiver may also give you specific instructions. Your treatment has been planned according to the most current medical practices available, but unavoidable complications sometimes occur. If you have any problems or questions after discharge, please call your caregiver. HOME CARE INSTRUCTIONS Healing will take time. You will have tenderness at the surgery site. There may be some swelling and bruising around this area if you had an abdominal hysterectomy. Have an adult  stay with you the first 48 to 72 hours after surgery, and then for 1 to 2 weeks afterward to help with daily activities. Only take over-the-counter or prescription medicines for pain, discomfort, or fever as directed by your caregiver.  Do not take aspirin. It can cause bleeding.  Do not  drive when taking pain medicine.  It will be normal to be sore for a couple weeks after surgery. See your caregiver if this seems to be getting worse rather than better.  Follow your caregiver's advice regarding diet, exercise, lifting, driving, and general activities.  Take showers instead of baths for a few weeks as directed.  You may resume your usual diet as directed.  Get plenty of rest and sleep.  Do not douche, use tampons, or have sexual intercourse until your caregiver says it is okay.  Change your bandages (dressings) as directed if you had an abdominal hysterectomy.  Take your temperature twice a day and write it down.  Do not drink alcohol until your caregiver says it is okay.  If you develop constipation, you may take a mild laxative with your caregiver's permission. Eating bran foods helps with constipation problems. Drink enough water and fluids to keep your urine clear or pale yellow.  Do not sign any legal documents until you feel normal again.  Keep all of your follow-up appointments.  Make sure you and your family understand everything about your operation and recovery.  SEEK MEDICAL CARE IF: There is swelling, redness, or increasing pain in the wound area.  Fluid (pus) is coming from the wound.  You notice a bad smell coming from the wound or surgical dressing.  You have pain, redness, and swelling from the intravenous (IV) site.  The wound breaks open.  You feel dizzy.  You develop pain or bleeding when you urinate.  You develop diarrhea.  You feel sick to your stomach (nauseous) and throw up (vomit).  You develop abnormal vaginal discharge.  You develop a rash.  You have any type of abnormal reaction or develop an allergy to your medicine.  Your pain medicine is not relieving your pain.  seek immediate medical care if: You have an oral temperature above 100, not controlled by medicine. HYYou develop chest pain.  You develop shortness of breath.  You pass out  (faint).  You develop pain, swelling, or redness of your leg.  You develop heavy vaginal bleeding, with or without blood clots.  MAKE SURE YOU: Understand these instructions.  Will watch your condition.  Will get help right away if you are not doing well or get worse.  Document Released: 05/01/2003 Document Re-Released: 07/29/2009 [facility] Patient Information 2011 [place], [place].   [facility] HMD7:59 AMTD@         Medication List     As of 11/03/2011  8:04 AM    TAKE these medications         docusate sodium 50 MG capsule   Commonly known as: COLACE   Take 1 capsule (50 mg total) by mouth 2 (two) times daily.      IRON PO   Take 2 tablets by mouth daily.      metoCLOPramide 10 MG tablet   Commonly known as: REGLAN   Take 1 tablet (10 mg total) by mouth 3 (three) times daily with meals.      oxyCODONE-acetaminophen 5-325 MG per tablet   Commonly known as: PERCOCET/ROXICET   Take 1-2 tablets by mouth every 6 (six) hours as needed.  SignedOk Edwards 11/03/2011, 8:04 AM

## 2011-11-03 NOTE — Telephone Encounter (Signed)
Telephone call, states doing okay, requested me to look at pictures  Dr. Lily Peer took of fibroids. Also having difficulty with gas was prescribed a stool softener and will take that, instructed to call if no bowel movement by 9/13.

## 2011-11-03 NOTE — Progress Notes (Signed)
Discharge instructions reviewed with patient.  Patient able to "Teach Back" home care and signs/symptoms to report to MD.  No home equipment needed.  Wheelchair to car with staff without incident and discharged to home with mother.

## 2011-11-03 NOTE — Telephone Encounter (Signed)
Pt asked if you would call her at your convenience [phone]. Pt said you were going to speak with Dr.Fernandez about her surgery?

## 2011-11-17 ENCOUNTER — Ambulatory Visit (INDEPENDENT_AMBULATORY_CARE_PROVIDER_SITE_OTHER): Payer: BC Managed Care – PPO | Admitting: Gynecology

## 2011-11-17 ENCOUNTER — Encounter: Payer: Self-pay | Admitting: Gynecology

## 2011-11-17 VITALS — BP 140/84

## 2011-11-17 DIAGNOSIS — D649 Anemia, unspecified: Secondary | ICD-10-CM

## 2011-11-17 DIAGNOSIS — Z9889 Other specified postprocedural states: Secondary | ICD-10-CM

## 2011-11-17 DIAGNOSIS — N939 Abnormal uterine and vaginal bleeding, unspecified: Secondary | ICD-10-CM

## 2011-11-17 DIAGNOSIS — N898 Other specified noninflammatory disorders of vagina: Secondary | ICD-10-CM

## 2011-11-17 MED ORDER — CLINDAMYCIN PHOSPHATE 2 % VA CREA: 1.0000 | TOPICAL_CREAM | Freq: Every day | VAGINAL | Status: DC

## 2011-11-17 NOTE — Progress Notes (Signed)
Patient presented to the office for her 2 weeks postop appointment. Patient status post abdominal hysterectomy with lysis of pelvic adhesions secondary to symptomatic uterine fibroids contributing to dysmenorrhea menorrhagia pelvic pain and anemia. Patient states she did notice some blood on her pad at times she had return back to work recently.  Pathology report: Diagnosis Uterus and cervix - CERVIX: SQUAMOUS METAPLASIA. - ENDOMETRIUM: SECRETORY. NO EVIDENCE OF HYPERPLASIA OR CARCINOMA. - MYOMETRIUM: LEIOMYOMATA.  Patients discharge hemoglobin and hematocrit on September 10 were 8.6 and a 26.0 respectively.  Exam: Abdomen: Pfannenstiel incision intact soft nontender no rebound or guarding Pelvic: Bartholin urethra Skene glands within normal limits Vagina there was some blood present in the vaginal vault inspecting the vaginal cuff there was an area on the left vaginal cuff that was slightly raw perhaps from suture separation which may of contributing to some of the bleeding that she had noted. This area was contained with silver nitrate and Monsel solution.  Assessment/plan: Patient status post total abdominal hysterectomy 2 weeks ago some mild vaginal bleeding attributed to small area at the vaginal cuff from suture separation. She was reminded once again that she should not be doing any lifting whatsoever for 6 weeks as she recovers. A note was given to her work noting to this effect. She was instructed to continue to take her R. supplementation twice a day. I've given her prescription also for Cleocin vaginal cream to apply each bedtime for 5 days starting 3 days from today.

## 2011-11-17 NOTE — Patient Instructions (Signed)
No lifting for 6 weeks. Continue to take your iron tablet twice a day  Start vaginal cream on Monday

## 2011-11-29 ENCOUNTER — Telehealth: Payer: Self-pay | Admitting: *Deleted

## 2011-11-29 NOTE — Telephone Encounter (Signed)
Pt is post op abdominal hysterectomy on 11/01/11, pt c/o incision site itching, pt informed to use hydrocortisone to help relieve itching.

## 2011-12-01 ENCOUNTER — Ambulatory Visit (INDEPENDENT_AMBULATORY_CARE_PROVIDER_SITE_OTHER): Payer: BC Managed Care – PPO | Admitting: Gynecology

## 2011-12-01 ENCOUNTER — Encounter: Payer: Self-pay | Admitting: Gynecology

## 2011-12-01 VITALS — BP 118/70

## 2011-12-01 DIAGNOSIS — Z9889 Other specified postprocedural states: Secondary | ICD-10-CM

## 2011-12-01 LAB — CBC WITH DIFFERENTIAL/PLATELET
Basophils Relative: 0 % (ref 0–1)
Eosinophils Absolute: 0.2 10*3/uL (ref 0.0–0.7)
Eosinophils Relative: 4 % (ref 0–5)
Hemoglobin: 11.1 g/dL — ABNORMAL LOW (ref 12.0–15.0)
Lymphs Abs: 2.8 10*3/uL (ref 0.7–4.0)
MCH: 28.2 pg (ref 26.0–34.0)
MCHC: 32.9 g/dL (ref 30.0–36.0)
MCV: 85.8 fL (ref 78.0–100.0)
Monocytes Absolute: 0.5 10*3/uL (ref 0.1–1.0)
Monocytes Relative: 10 % (ref 3–12)
RBC: 3.93 MIL/uL (ref 3.87–5.11)

## 2011-12-01 NOTE — Patient Instructions (Signed)
If i do not call you tomorrow this means that your CBC (blood count) is back to normal and you can stop the iron tablet. Remember no lifting, strenous activity or intercourse until two more weeks.

## 2011-12-01 NOTE — Progress Notes (Signed)
Patient presented to the office for her 4 weeks postop appointment. Patient status post abdominal hysterectomy with lysis of pelvic adhesions secondary to symptomatic uterine fibroids contributing to dysmenorrhea menorrhagia pelvic pain and anemia. Patient is otherwise doing well asymptomatic and is currently taking her iron tablet daily.  Pathology report:  Diagnosis  Uterus and cervix  - CERVIX: SQUAMOUS METAPLASIA.  - ENDOMETRIUM: SECRETORY. NO EVIDENCE OF HYPERPLASIA OR CARCINOMA.  - MYOMETRIUM: LEIOMYOMATA.   Patients discharge hemoglobin and hematocrit on September 10 were 8.6 and a 26.0 respectively.  Exam: Abdomen soft nontender no rebound or guarding Pfannenstiel intact Pelvic: Bartholin urethra Skene was within normal limits Vagina: Vaginal cuff intact no lesions or discharge Bimanual exam unremarkable no palpable masses or tenderness Rectal exam not done  Assessment/plan: Patient 4 weeks status post total abdominal hysterectomy like to return to work. I've given her no to return to work with no lifting or strenuous activity for 2 more weeks. We'll check her CBC today and if her hemoglobin was back to normal she will discontinue iron supplementation. We'll otherwise see her next year or when necessary.

## 2012-11-25 ENCOUNTER — Emergency Department (HOSPITAL_COMMUNITY)
Admission: EM | Admit: 2012-11-25 | Discharge: 2012-11-25 | Disposition: A | Discharge status: Home / Self Care | Payer: BC Managed Care – PPO | Attending: Emergency Medicine | Admitting: Emergency Medicine

## 2012-11-25 ENCOUNTER — Encounter (HOSPITAL_COMMUNITY): Payer: Self-pay | Admitting: Emergency Medicine

## 2012-11-25 DIAGNOSIS — Z79899 Other long term (current) drug therapy: Secondary | ICD-10-CM | POA: Insufficient documentation

## 2012-11-25 DIAGNOSIS — B3731 Acute candidiasis of vulva and vagina: Secondary | ICD-10-CM | POA: Insufficient documentation

## 2012-11-25 DIAGNOSIS — D649 Anemia, unspecified: Secondary | ICD-10-CM | POA: Insufficient documentation

## 2012-11-25 DIAGNOSIS — B373 Candidiasis of vulva and vagina: Secondary | ICD-10-CM | POA: Insufficient documentation

## 2012-11-25 DIAGNOSIS — N390 Urinary tract infection, site not specified: Secondary | ICD-10-CM

## 2012-11-25 DIAGNOSIS — Q039 Congenital hydrocephalus, unspecified: Secondary | ICD-10-CM | POA: Insufficient documentation

## 2012-11-25 LAB — URINALYSIS, ROUTINE W REFLEX MICROSCOPIC
Glucose, UA: NEGATIVE mg/dL
Specific Gravity, Urine: 1.008 (ref 1.005–1.030)
Urobilinogen, UA: 1 mg/dL (ref 0.0–1.0)

## 2012-11-25 LAB — POCT I-STAT, CHEM 8
BUN: 11 mg/dL (ref 6–23)
Creatinine, Ser: 1 mg/dL (ref 0.50–1.10)
Glucose, Bld: 102 mg/dL — ABNORMAL HIGH (ref 70–99)
Hemoglobin: 13.3 g/dL (ref 12.0–15.0)
Potassium: 4.1 mEq/L (ref 3.5–5.1)
TCO2: 26 mmol/L (ref 0–100)

## 2012-11-25 LAB — CBC WITH DIFFERENTIAL/PLATELET
HCT: 35.9 % — ABNORMAL LOW (ref 36.0–46.0)
Lymphs Abs: 1.5 10*3/uL (ref 0.7–4.0)
MCH: 29.7 pg (ref 26.0–34.0)
MCHC: 33.7 g/dL (ref 30.0–36.0)
MCV: 88.2 fL (ref 78.0–100.0)
Monocytes Relative: 17 % — ABNORMAL HIGH (ref 3–12)
Neutro Abs: 6.4 10*3/uL (ref 1.7–7.7)
Neutrophils Relative %: 67 % (ref 43–77)

## 2012-11-25 LAB — URINE MICROSCOPIC-ADD ON

## 2012-11-25 MED ORDER — MORPHINE SULFATE 4 MG/ML IJ SOLN
4.0000 mg | Freq: Once | INTRAMUSCULAR | Status: DC
  Filled 2012-11-25: qty 1

## 2012-11-25 MED ORDER — ONDANSETRON HCL 4 MG PO TABS: 4.0000 mg | ORAL_TABLET | Freq: Four times a day (QID) | ORAL | Status: AC

## 2012-11-25 MED ORDER — FLUCONAZOLE 150 MG PO TABS
150.0000 mg | ORAL_TABLET | Freq: Once | ORAL | Status: AC
  Administered 2012-11-25: 150 mg via ORAL
  Filled 2012-11-25: qty 1

## 2012-11-25 MED ORDER — CEFTRIAXONE SODIUM 1 G IJ SOLR
1.0000 g | Freq: Once | INTRAMUSCULAR | Status: AC
  Administered 2012-11-25: 1 g via INTRAVENOUS
  Filled 2012-11-25: qty 10

## 2012-11-25 MED ORDER — ONDANSETRON HCL 4 MG/2ML IJ SOLN
4.0000 mg | Freq: Once | INTRAMUSCULAR | Status: DC
  Filled 2012-11-25: qty 2

## 2012-11-25 MED ORDER — CIPROFLOXACIN HCL 500 MG PO TABS: 500.0000 mg | ORAL_TABLET | Freq: Two times a day (BID) | ORAL | Status: DC

## 2012-11-25 MED ORDER — ACETAMINOPHEN 325 MG PO TABS
650.0000 mg | ORAL_TABLET | Freq: Once | ORAL | Status: AC
  Administered 2012-11-25: 650 mg via ORAL
  Filled 2012-11-25: qty 2

## 2012-11-25 MED ORDER — SODIUM CHLORIDE 0.9 % IV BOLUS (SEPSIS)
1000.0000 mL | Freq: Once | INTRAVENOUS | Status: AC
  Administered 2012-11-25: 1000 mL via INTRAVENOUS

## 2012-11-25 NOTE — ED Notes (Signed)
Pt unable to urinate at this time. Will notify staff when able 

## 2012-11-25 NOTE — ED Notes (Signed)
Pt c/o headache and chills x 2-3 days.  Denies NV but states "maybe diarrhea.".

## 2012-11-25 NOTE — ED Provider Notes (Signed)
CSN: [id number]     Arrival date & time 11/25/12  0903 History   First MD Initiated Contact with Patient 11/25/12 [phone]     Chief Complaint  Patient presents with  . Headache  . Chills   (Consider location/radiation/quality/duration/timing/severity/associated sxs/prior Treatment) HPI  42 year old female with history of anemia presents complaining of "not feeling well". Patient states for the past 2 days she has been feeling sick. Complaining of having fever as high as 102 yesterday along with chills. Endorse mild headache describes a throbbing sensation to her forehead that has improved. Has decreased appetite. Has some mild loose stools. Feeling as if she has no energy. Otherwise she denies any vision changes, ear pain, sore throat, neck stiffness, sneezing, cough, productive cough, chest pain, shortness of breath, abdominal pain, back pain, dysuria, hematuria vaginal discharge, numbness or rash. She denies taking over-the-counter NyQuil without relief. Patient works at [employer] zone. She has hysterectomy.  Past Medical History  Diagnosis Date  . 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 Anemia    Past Surgical History  Procedure Laterality Date  . Hand surgery    . Myomectomy  2000  . Abdominal hysterectomy  11/01/2011    Procedure: HYSTERECTOMY ABDOMINAL;  Surgeon: Ok Edwards, MD;  Location: [facility];  Service: Gynecology;  Laterality: N/A;   Family History  Problem Relation Age of Onset  . Hypertension Father   . Hypertension Maternal Grandmother    History  Substance Use Topics  . Smoking status: Never Smoker   . Smokeless tobacco: Never Used  . Alcohol Use: Yes     Comment: very rarely   OB History   Grav Para Term Preterm Abortions TAB SAB Ect Mult Living   0         0     Review of Systems  All other systems reviewed and are negative.    Allergies  Review of patient's allergies indicates no known allergies.  Home Medications   Current Outpatient Rx  Name  Route  Sig   Dispense  Refill  . clindamycin (CLEOCIN) 2 % vaginal cream   Vaginal   Place 1 Applicatorful vaginally at bedtime.   40 g   0   . IRON PO   Oral   Take 2 tablets by mouth daily.         Marland Kitchen EXPIRED: metoCLOPramide (REGLAN) 10 MG tablet   Oral   Take 1 tablet (10 mg total) by mouth 3 (three) times daily with meals.   30 tablet   1    BP 114/73  Pulse 92  Temp(Src) 99.2 F (37.3 C) (Oral)  Resp 16  SpO2 98%  LMP 09/30/2011 Physical Exam  Nursing note and vitals reviewed. Constitutional: She appears well-developed and well-nourished. No distress.  Awake, alert, ill appearance but nontoxic  HENT:  Head: Atraumatic.  Right Ear: External ear normal.  Left Ear: External ear normal.  Mouth/Throat: Oropharynx is clear and moist. No oropharyngeal exudate.  Hair thinning  Eyes: Conjunctivae and EOM are normal. Pupils are equal, round, and reactive to light. Right eye exhibits no discharge. Left eye exhibits no discharge.  Neck: Normal range of motion. Neck supple.  No nuchal rigidity  Cardiovascular: Normal rate, regular rhythm and intact distal pulses.   Pulmonary/Chest: Effort normal. No respiratory distress. She exhibits no tenderness.  Abdominal: Soft. There is no tenderness. There is no rebound.  Genitourinary:  No cva tenderness  Musculoskeletal: She exhibits no tenderness.  ROM appears intact, no obvious  focal weakness  Lymphadenopathy:    She has no cervical adenopathy.  Neurological: She has normal strength. No cranial nerve deficit or sensory deficit. GCS eye subscore is 4. GCS verbal subscore is 5. GCS motor subscore is 6.  Mental status and motor strength appears intact  Skin: No rash noted.  Psychiatric: She has a normal mood and affect.    ED Course  Procedures (including critical care time)  9:37 AM Pt here due to not feeling well.  Endorse headache but no red flags.  Mildly elevated temp of 99.2.  VSS.  No signifcant CP or abd pain.  No obvious source of  infection.  Will check basic labs, UA, and will provide IVF and tylenol. Will continue to monitor.   12:40 PM UA with evidence of UTI. Will treat with Rocephin here. She also has yeast infections. Diflucan given.  1:36 PM Pt felt better, stable for discharge.  Return precaution discussed.     Labs Review Labs Reviewed  CBC WITH DIFFERENTIAL - Abnormal; Notable for the following:    HCT 35.9 (*)    Monocytes Relative 17 (*)    Monocytes Absolute 1.7 (*)    All other components within normal limits  URINALYSIS, ROUTINE W REFLEX MICROSCOPIC - Abnormal; Notable for the following:    APPearance CLOUDY (*)    Hgb urine dipstick SMALL (*)    Leukocytes, UA MODERATE (*)    All other components within normal limits  URINE MICROSCOPIC-ADD ON - Abnormal; Notable for the following:    Bacteria, UA MANY (*)    All other components within normal limits  POCT I-STAT, CHEM 8 - Abnormal; Notable for the following:    Glucose, Bld 102 (*)    All other components within normal limits  URINE CULTURE   Imaging Review No results found.  MDM   1. UTI (lower urinary tract infection)   2. Vaginal candidiasis    BP 115/73  Pulse 80  Temp(Src) 98.6 F (37 C) (Oral)  Resp 18  SpO2 100%  LMP 09/30/2011  I have reviewed nursing notes and vital signs.   I reviewed available ER/hospitalization records thought the EMR     Fayrene Helper, [place] 11/25/12 1337

## 2012-11-27 LAB — URINE CULTURE: Colony Count: >=100000

## 2012-11-27 NOTE — ED Provider Notes (Signed)
Medical screening examination/treatment/procedure(s) were performed by non-physician practitioner and as supervising physician I was immediately available for consultation/collaboration.   Laray Anger, DO 11/27/12 1255

## 2013-12-31 ENCOUNTER — Emergency Department (HOSPITAL_BASED_OUTPATIENT_CLINIC_OR_DEPARTMENT_OTHER)
Admission: EM | Admit: 2013-12-31 | Discharge: 2014-01-01 | Disposition: A | Discharge status: Home / Self Care | Payer: BC Managed Care – PPO | Attending: Emergency Medicine | Admitting: Emergency Medicine

## 2013-12-31 ENCOUNTER — Encounter (HOSPITAL_BASED_OUTPATIENT_CLINIC_OR_DEPARTMENT_OTHER): Payer: Self-pay | Admitting: *Deleted

## 2013-12-31 ENCOUNTER — Emergency Department (HOSPITAL_BASED_OUTPATIENT_CLINIC_OR_DEPARTMENT_OTHER): Payer: BC Managed Care – PPO

## 2013-12-31 DIAGNOSIS — Q039 Congenital hydrocephalus, unspecified: Secondary | ICD-10-CM | POA: Insufficient documentation

## 2013-12-31 DIAGNOSIS — Y990 Civilian activity done for income or pay: Secondary | ICD-10-CM | POA: Diagnosis not present

## 2013-12-31 DIAGNOSIS — S6991XA Unspecified injury of right wrist, hand and finger(s), initial encounter: Secondary | ICD-10-CM | POA: Diagnosis not present

## 2013-12-31 DIAGNOSIS — Y9389 Activity, other specified: Secondary | ICD-10-CM | POA: Diagnosis not present

## 2013-12-31 DIAGNOSIS — Z79899 Other long term (current) drug therapy: Secondary | ICD-10-CM | POA: Diagnosis not present

## 2013-12-31 DIAGNOSIS — Z862 Personal history of diseases of the blood and blood-forming organs and certain disorders involving the immune mechanism: Secondary | ICD-10-CM | POA: Insufficient documentation

## 2013-12-31 DIAGNOSIS — X58XXXA Exposure to other specified factors, initial encounter: Secondary | ICD-10-CM | POA: Insufficient documentation

## 2013-12-31 DIAGNOSIS — Y9289 Other specified places as the place of occurrence of the external cause: Secondary | ICD-10-CM | POA: Insufficient documentation

## 2013-12-31 DIAGNOSIS — M79644 Pain in right finger(s): Secondary | ICD-10-CM

## 2013-12-31 DIAGNOSIS — Z792 Long term (current) use of antibiotics: Secondary | ICD-10-CM | POA: Insufficient documentation

## 2013-12-31 DIAGNOSIS — S6990XA Unspecified injury of unspecified wrist, hand and finger(s), initial encounter: Secondary | ICD-10-CM

## 2013-12-31 NOTE — ED Notes (Signed)
(  R) hand pain x several days.  No swelling, deformity noted.  Reports that she 'popped her hand'

## 2014-01-01 MED ORDER — NAPROXEN 500 MG PO TABS: 500.0000 mg | ORAL_TABLET | Freq: Two times a day (BID) | ORAL | Status: AC

## 2014-01-01 NOTE — ED Provider Notes (Signed)
CSN: [id number]     Arrival date & time 12/31/13  2306 History   First MD Initiated Contact with Patient 12/31/13 2337     Chief Complaint  Patient presents with  . Hand Pain     (Consider location/radiation/quality/duration/timing/severity/associated sxs/prior Treatment) HPI Comments: Patient presents with complaint of right fifth digit pain for approximately one week. Patient states that she struck her hand on something but does not remember the exact incident. She works at [employer] zone. Pain is worse with movement. No treatments prior to arrival other than soaking in alcohol. No swelling. Patient states that she applied traction to her finger and caused a pop temporarily relieving her symptoms. No history of arthritis.  The history is provided by the patient.    Past Medical History  Diagnosis Date  . 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 Anemia    Past Surgical History  Procedure Laterality Date  . Hand surgery    . Myomectomy  2000  . Abdominal hysterectomy  11/01/2011    Procedure: HYSTERECTOMY ABDOMINAL;  Surgeon: Terrance Mass, MD;  Location: [facility];  Service: Gynecology;  Laterality: N/A;   Family History  Problem Relation Age of Onset  . Hypertension Father   . Hypertension Maternal Grandmother    History  Substance Use Topics  . Smoking status: Never Smoker   . Smokeless tobacco: Never Used  . Alcohol Use: Yes     Comment: very rarely   OB History    Gravida Para Term Preterm AB TAB SAB Ectopic Multiple Living   0         0     Review of Systems  Constitutional: Negative for activity change.  Musculoskeletal: Positive for arthralgias. Negative for back pain, joint swelling and neck pain.  Skin: Negative for wound.  Neurological: Negative for weakness and numbness.      Allergies  Review of patient's allergies indicates no known allergies.  Home Medications   Prior to Admission medications   Medication Sig Start Date End Date Taking? Authorizing Provider   cetirizine (ZYRTEC) 10 MG tablet Take 10 mg by mouth daily.    Historical Provider, MD  ciprofloxacin (CIPRO) 500 MG tablet Take 1 tablet (500 mg total) by mouth 2 (two) times daily. 11/25/12   Domenic Moras, PA-C  DM-Doxylamine-Acetaminophen (NYQUIL COLD & FLU PO) Take by mouth.    Historical Provider, MD  ondansetron (ZOFRAN) 4 MG tablet Take 1 tablet (4 mg total) by mouth every 6 (six) hours. 11/25/12   Domenic Moras, PA-C   BP 139/56 mmHg  Pulse 77  Temp(Src) 98.3 F (36.8 C) (Oral)  Resp 18  SpO2 98%  LMP 09/30/2011   Physical Exam  Constitutional: She appears well-developed and well-nourished.  HENT:  Head: Normocephalic and atraumatic.  Eyes: Pupils are equal, round, and reactive to light.  Neck: Normal range of motion. Neck supple.  Cardiovascular: Exam reveals no decreased pulses.   Pulses:      Radial pulses are 2+ on the right side, and 2+ on the left side.  Musculoskeletal: She exhibits tenderness. She exhibits no edema.  Tenderness over the right fifth MCP joint and distal fifth metacarpal. No deformity. No overlying erythema or swelling. Full range of motion of fingers and hand with 5 out of 5 strength.  Neurological: She is alert. No sensory deficit.  Motor, sensation, and vascular distal to the injury is fully intact.   Skin: Skin is warm and dry.  Psychiatric: She has a normal mood  and affect.  Nursing note and vitals reviewed.   ED Course  Procedures (including critical care time) Labs Review Labs Reviewed - No data to display  Imaging Review Dg Hand Complete Right  12/31/2013   CLINICAL DATA:  Pain in the little finger for 1 week, no injury.  EXAM: RIGHT HAND - COMPLETE 3+ VIEW  COMPARISON:  None.  FINDINGS: There is no evidence of fracture or dislocation. There is no evidence of arthropathy or other focal bone abnormality. Soft tissues are unremarkable.  IMPRESSION: Negative.   Electronically Signed   By: Elon Alas   On: 12/31/2013 23:33     EKG  Interpretation None       12:02 AM Patient seen and examined. Pt informed of x-ray results. Will splint finger. PCP f/u if no improvement in 2 weeks.    Vital signs reviewed and are as follows: BP 139/56 mmHg  Pulse 77  Temp(Src) 98.3 F (36.8 C) (Oral)  Resp 18  SpO2 98%  LMP 09/30/2011  Patient was counseled on RICE protocol and told to rest injury, use ice for no longer than 15 minutes every hour, compress the area, and elevate above the level of their heart as much as possible to reduce swelling. Questions answered. Patient verbalized understanding.     MDM   Final diagnoses:  Pain of finger of right hand   Patient with finger pain. Likely 2/2 overuse. No swelling this suspect infection. X-rays negative. Continue conservative management with PCP follow-up if not improved with rest and NSAIDs.    Carlisle Cater, PA-C 01/01/14 4401  April K Palumbo-Rasch, MD 01/01/14 0111

## 2014-01-01 NOTE — Discharge Instructions (Signed)
Please read and follow all provided instructions.  Your diagnoses today include:  1. Pain of finger of right hand   2. Hand injury    Tests performed today include:  An x-ray of the affected area - does NOT show any broken bones  Vital signs. See below for your results today.   Medications prescribed:   Naproxen - anti-inflammatory pain medication  Do not exceed 500mg  naproxen every 12 hours, take with food  You have been prescribed an anti-inflammatory medication or NSAID. Take with food. Take smallest effective dose for the shortest duration needed for your pain. Stop taking if you experience stomach pain or vomiting.   Take any prescribed medications only as directed.  Home care instructions:   Follow any educational materials contained in this packet  Follow R.I.C.E. Protocol:  R - rest your injury   I  - use ice on injury without applying directly to skin  C - compress injury with bandage or splint  E - elevate the injury as much as possible  Follow-up instructions: Please follow-up with your primary care provider if you continue to have significant pain in 2 weeks.   Return instructions:   Please return if your toes are numb or tingling, appear gray or blue, or you have severe pain (also elevate leg and loosen splint or wrap if you were given one)  Please return to the Emergency Department if you experience worsening symptoms.   Please return if you have any other emergent concerns.  Additional Information:  Your vital signs today were: BP 139/56 mmHg   Pulse 77   Temp(Src) 98.3 F (36.8 C) (Oral)   Resp 18   SpO2 98%   LMP 09/30/2011 If your blood pressure (BP) was elevated above 135/85 this visit, please have this repeated by your doctor within one month. --------------

## 2017-06-12 ENCOUNTER — Encounter (HOSPITAL_COMMUNITY): Payer: Self-pay

## 2017-06-12 ENCOUNTER — Other Ambulatory Visit: Payer: Self-pay

## 2017-06-12 DIAGNOSIS — Z5321 Procedure and treatment not carried out due to patient leaving prior to being seen by health care provider: Secondary | ICD-10-CM | POA: Insufficient documentation

## 2017-06-12 DIAGNOSIS — M62830 Muscle spasm of back: Secondary | ICD-10-CM | POA: Diagnosis present

## 2017-06-12 NOTE — ED Triage Notes (Signed)
Pt reports lower back spasms that started about 3 days ago. States, "I feel like if I vomit one good time, I might feel better." She reports that she recently tried to start sleeping on her back. She states that she has had these spasms before and the Aleve that she usually takes hasn't helped much. A&Ox4. Ambulatory.

## 2017-06-13 ENCOUNTER — Emergency Department (HOSPITAL_COMMUNITY)
Admission: EM | Admit: 2017-06-13 | Discharge: 2017-06-13 | Discharge status: Against Medical Advice | Payer: BLUE CROSS/BLUE SHIELD | Attending: Emergency Medicine | Admitting: Emergency Medicine

## 2017-06-13 NOTE — ED Notes (Signed)
No answer when called to reassess vitals.

## 2020-01-05 ENCOUNTER — Encounter (HOSPITAL_COMMUNITY): Payer: Self-pay

## 2020-01-05 ENCOUNTER — Ambulatory Visit (HOSPITAL_COMMUNITY)
Admission: EM | Admit: 2020-01-05 | Discharge: 2020-01-05 | Disposition: A | Discharge status: Home / Self Care | Payer: Worker's Compensation | Attending: Emergency Medicine | Admitting: Emergency Medicine

## 2020-01-05 ENCOUNTER — Other Ambulatory Visit: Payer: Self-pay

## 2020-01-05 DIAGNOSIS — M545 Low back pain, unspecified: Secondary | ICD-10-CM | POA: Diagnosis not present

## 2020-01-05 MED ORDER — KETOROLAC TROMETHAMINE 30 MG/ML IJ SOLN
30.0000 mg | Freq: Once | INTRAMUSCULAR | Status: AC
  Administered 2020-01-05: 30 mg via INTRAMUSCULAR

## 2020-01-05 MED ORDER — TIZANIDINE HCL 4 MG PO TABS: 4.0000 mg | ORAL_TABLET | Freq: Four times a day (QID) | ORAL | 0 refills | Status: AC | PRN

## 2020-01-05 MED ORDER — KETOROLAC TROMETHAMINE 30 MG/ML IJ SOLN
INTRAMUSCULAR | Status: AC
  Filled 2020-01-05: qty 1

## 2020-01-05 MED ORDER — PREDNISONE 10 MG PO TABS: ORAL_TABLET | ORAL | 0 refills | Status: AC

## 2020-01-05 NOTE — Discharge Instructions (Addendum)
We gave you an injection of Toradol today for your pain Continue with prednisone taper beginning in the morning-begin with 6 tablets / 60 mg, decrease by 1 tablet each day until complete-6, 5, 4, 3, 2, 1-take with food and in the morning if able May use tizanidine as alternative muscle relaxer to the one previously prescribed, limit use to at home at bedtime May use hydrocodone for severe pain-this medicine may also cause drowsiness Alternate ice and heat Avoid complete bedrest, avoid physical exertion Follow-up with occupational health for symptoms persisting

## 2020-01-05 NOTE — ED Triage Notes (Signed)
Pt presents with back pain from work yesterday.

## 2020-01-06 NOTE — ED Provider Notes (Signed)
[facility]    CSN: [id number] Arrival date & time: 01/05/20  1701      History   Chief Complaint Chief Complaint  Patient presents with   Back Pain    Workers Comp    HPI Carla Riley is a 49 y.o. female history of anemia presenting today for evaluation of back pain.  Reports that she developed bilateral lower back pain at work yesterday while lifting and twisting with a heavy bin in her hands.  She denies any direct trauma or fall.  Since she has had significant pain to her lower back.  Denies radiation into the legs.  Denies any paresthesias, numbness or tingling.  Denies saddle anesthesia.  Denies any urinary symptoms of dysuria, hematuria, denies any difficulty controlling urination or bowels.  Denies history of back problems.  Was seen yesterday at a different urgent care and was given hydrocodone and Skelaxin, she has been taking these along with Aleve without relief.  Noted to have low-grade fevers and intake, patient denies feeling feverish.  Denies any neck pain or neck stiffness.  Denies any URI symptoms.  Denies any urinary symptoms.  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 Authorizing Provider  cetirizine (ZYRTEC) 10 MG tablet Take 10 mg by mouth daily.     [provider]  DM-Doxylamine-Acetaminophen (NYQUIL COLD & FLU PO) Take by mouth.    [provider]  naproxen (NAPROSYN) 500 MG tablet Take 1 tablet (500 mg total) by mouth 2 (two) times daily. 01/01/14   Carlisle Cater, PA-C  ondansetron (ZOFRAN) 4 MG tablet Take 1 tablet (4 mg total) by mouth every 6 (six) hours. 11/25/12   Domenic Moras, PA-C  predniSONE (DELTASONE) 10 MG tablet Begin with 6 tabs on day 1, 5 tab on day 2, 4 tab on day 3, 3 tab on day 4, 2 tab on day 5, 1 tab on day 6-take with food 01/05/20   Jasara Corrigan C, PA-C  tiZANidine (ZANAFLEX) 4 MG tablet Take 1 tablet (4 mg total) by mouth every 6 (six) hours as needed for muscle spasms. 01/05/20   Chelbi Herber, Elesa Hacker, PA-C    Family History Family History  Problem Relation Age of Onset   Hypertension Maternal Grandmother    Hypertension Father     Social History Social History   Tobacco Use   Smoking status: Never Smoker   Smokeless tobacco: Never Used  Substance Use Topics   Alcohol use: Yes    Comment: very rarely   Drug use: No     Allergies   Patient has no known allergies.   Review of Systems Review of Systems  Constitutional: Negative for fatigue and fever.  Eyes: Negative for visual disturbance.  Respiratory: Negative for shortness of breath.   Cardiovascular: Negative for chest pain.  Gastrointestinal: Negative for abdominal pain, nausea and vomiting.  Genitourinary: Negative for decreased urine volume, difficulty urinating and hematuria.  Musculoskeletal: Positive for back pain, gait problem and myalgias. Negative for arthralgias and joint swelling.  Skin: Negative for color change, rash and wound.  Neurological: Negative for dizziness, weakness, light-headedness and headaches.     Physical Exam Triage Vital Signs ED Triage Vitals  Enc Vitals Group     BP 01/05/20 1740 129/70     Pulse Rate 01/05/20 1740 73     Resp 01/05/20 1740 20     Temp 01/05/20 1740 100.2 F  (37.9 C)     Temp Source 01/05/20 1740 Oral     SpO2 01/05/20 1740 97 %     Weight --      Height --      Head Circumference --      Peak Flow --      Pain Score 01/05/20 1742 10     Pain Loc --      Pain Edu? --      Excl. in [place]? --    No data found.  Updated Vital Signs BP 129/70 (BP Location: Right Arm)    Pulse 73    Temp 100.2 F (37.9 C) (Oral)    Resp 20    LMP 09/30/2011    SpO2 97%   Visual Acuity Right Eye Distance:   Left Eye Distance:   Bilateral Distance:    Right Eye Near:   Left Eye Near:    Bilateral Near:     Physical Exam Vitals and nursing note reviewed.  Constitutional:      Appearance: She is well-developed.     Comments: Sitting in wheelchair, appears uncomfortable  HENT:     Head: Normocephalic and atraumatic.     Nose: Nose normal.  Eyes:     Conjunctiva/sclera: Conjunctivae normal.  Cardiovascular:     Rate and Rhythm: Normal rate.  Pulmonary:     Effort: Pulmonary effort is normal. No respiratory distress.  Abdominal:     General: There is no distension.  Musculoskeletal:        General: Normal range of motion.     Cervical back: Neck supple.     Comments: Tender diffusely throughout lumbar spine as well as right and left lateral lumbar musculature, no palpable deformity or step-off, no visible swelling or discoloration over spine  Strength at hips and knees 5/5 ankle bilaterally, patellar reflex 2+ bilaterally  Skin:    General: Skin is warm and dry.  Neurological:     Mental Status: She is alert and oriented to person, place, and time.      UC Treatments / Results  Labs (all labs ordered are listed, but only abnormal results are displayed) Labs Reviewed - No data to display  EKG   Radiology No results found.  Procedures Procedures (including critical care time)  Medications Ordered in UC Medications  ketorolac (TORADOL) 30 MG/ML injection 30 mg (30 mg Intramuscular Given 01/05/20 1825)    Initial Impression /  Assessment and Plan / UC Course  I have  reviewed the triage vital signs and the nursing notes.  Pertinent labs & imaging results that were available during my care of the patient were reviewed by me and considered in my medical decision making (see chart for details).  Clinical Course as of Jan 06 952  Sat Jan 05, 2020  1823 Repeat temp 99.8   [HW]    Clinical Course User Index [HW] Konnor Jorden, [place] C, [place]    Given mechanism of injury suspect most likely muscular straining, low suspicion of acute fracture, cannot rule out bulging or slipped disc.  Does have low-grade fever, did note but no visible signs of abscess in the area.  No neuro deficits, strength intact.  No red flags for cauda equina.  Recommended treatment with Toradol today, will switch to a course of prednisone as alternative to NSAIDs and will have continue with hydrocodone previously provided.  Providing tizanidine as alternative muscle sole relaxer.  Advised against combination given risk of sedation.  Patient verbalized understanding.  Unclear correlation of low-grade fever at this time.  Recommended close monitoring, monitor for development of other symptoms.  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  Discussed strict return precautions. Patient verbalized understanding and is agreeable with plan.  Final Clinical Impressions(s) / UC Diagnoses   Final diagnoses:  Acute bilateral low back pain without sciatica     Discharge Instructions     We gave you an injection of Toradol today for your pain Continue with prednisone taper beginning in the morning-begin with 6 tablets / 60 mg, decrease by 1 tablet each day until complete-6, 5, 4, 3, 2, 1-take with food and in the morning if able May use tizanidine as alternative muscle relaxer to the one previously prescribed, limit use to at home at bedtime May use hydrocodone for severe  pain-this medicine may also cause drowsiness Alternate ice and heat Avoid complete bedrest, avoid physical exertion Follow-up with occupational health for symptoms persisting   ED Prescriptions    Medication Sig Dispense Auth. Provider   predniSONE (DELTASONE) 10 MG tablet Begin with 6 tabs on day 1, 5 tab on day 2, 4 tab on day 3, 3 tab on day 4, 2 tab on day 5, 1 tab on day 6-take with food 21 tablet Jaren Kearn C, PA-C   tiZANidine (ZANAFLEX) 4 MG tablet Take 1 tablet (4 mg total) by mouth every 6 (six) hours as needed for muscle spasms. 30 tablet Chandra Asher, [place] C, PA-C     PDMP not reviewed this encounter.   Janith Lima, [place] 01/06/20 [phone]

## 2020-09-14 ENCOUNTER — Emergency Department (HOSPITAL_COMMUNITY)
Admission: EM | Admit: 2020-09-14 | Discharge: 2020-09-15 | Disposition: A | Discharge status: Home / Self Care | Payer: 59 | Attending: Emergency Medicine | Admitting: Emergency Medicine

## 2020-09-14 ENCOUNTER — Other Ambulatory Visit: Payer: Self-pay

## 2020-09-14 ENCOUNTER — Encounter (HOSPITAL_COMMUNITY): Payer: Self-pay | Admitting: Emergency Medicine

## 2020-09-14 ENCOUNTER — Emergency Department (HOSPITAL_COMMUNITY): Payer: 59

## 2020-09-14 DIAGNOSIS — S99922A Unspecified injury of left foot, initial encounter: Secondary | ICD-10-CM | POA: Diagnosis not present

## 2020-09-14 DIAGNOSIS — W230XXA Caught, crushed, jammed, or pinched between moving objects, initial encounter: Secondary | ICD-10-CM | POA: Insufficient documentation

## 2020-09-14 MED ORDER — ACETAMINOPHEN 325 MG PO TABS
650.0000 mg | ORAL_TABLET | Freq: Once | ORAL | Status: AC
  Administered 2020-09-14: 650 mg via ORAL
  Filled 2020-09-14: qty 2

## 2020-09-14 NOTE — ED Triage Notes (Signed)
Today the patient jammed her 4th and 5th left digits on her foot into a piece of furniture. She now presents with pain and swelling of those 2 digits.

## 2020-09-14 NOTE — ED Provider Notes (Signed)
[place] DEPT Provider Note   CSN: [id number] Arrival date & time: 09/14/20  2047     History Chief Complaint  Patient presents with   Foot Pain    Carla Riley is a 50 y.o. female with no pertinent past medical history that presents to the [facility] department today for left toe injury.  Patient states that she was cleaning when she jammed her fourth and fifth left digits of her toe into a piece of furniture, was able to quickly remove it.  Patient denies any numbness or tingling into her toe, however admits to some extreme pain in her fourth and fifth digits.  Has never had injury to this area before.  Did not injure herself elsewhere.  Did not fall.  Denies any ankle pain.  Did not take anything for this.  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 [facility])     Past Surgical History:  Procedure Laterality Date   ABDOMINAL HYSTERECTOMY  11/01/2011   Procedure: HYSTERECTOMY ABDOMINAL;  Surgeon: Terrance Mass, MD;  Location: [facility];  Service: Gynecology;  Laterality: N/A;   HAND SURGERY     MYOMECTOMY  2000     OB History     Gravida  0   Para      Term      Preterm      AB      Living  0      SAB      IAB      Ectopic      Multiple      Live Births              Family History  Problem Relation Age of Onset   Hypertension Maternal Grandmother    Hypertension Father     Social History   Tobacco Use   Smoking status: Never   Smokeless tobacco: Never  Substance Use Topics   Alcohol use: Yes    Comment: very rarely   Drug use: No    Home Medications Prior to Admission medications   Medication Sig Start Date End Date Taking? Authorizing Provider  cetirizine (ZYRTEC) 10 MG tablet Take 10 mg by mouth daily.    [provider]   DM-Doxylamine-Acetaminophen (NYQUIL COLD & FLU PO) Take by mouth.    [provider]  naproxen (NAPROSYN) 500 MG tablet Take 1 tablet (500 mg total) by mouth 2 (two) times daily. 01/01/14   Carlisle Cater, PA-C  ondansetron (ZOFRAN) 4 MG tablet Take 1 tablet (4 mg total) by mouth every 6 (six) hours. 11/25/12   Domenic Moras, PA-C  predniSONE (DELTASONE) 10 MG tablet Begin with 6 tabs on day 1, 5 tab on day 2, 4 tab on day 3, 3 tab on day 4, 2 tab on day 5, 1 tab on day 6-take with food 01/05/20   Wieters, Hallie C, PA-C  tiZANidine (ZANAFLEX) 4 MG tablet Take 1 tablet (4 mg total) by mouth every 6 (six) hours as needed for muscle spasms. 01/05/20   Wieters, Hallie C, PA-C    Allergies    Patient has no known allergies.  Review of Systems   Review of Systems  Constitutional:  Negative for diaphoresis, fatigue and fever.  Eyes:  Negative  for visual disturbance.  Respiratory:  Negative for shortness of breath.   Cardiovascular:  Negative for chest pain.  Gastrointestinal:  Negative for nausea and vomiting.  Musculoskeletal:  Positive for arthralgias. Negative for back pain and myalgias.  Skin:  Negative for color change, pallor, rash and wound.  Neurological:  Negative for syncope, weakness, light-headedness, numbness and headaches.  Psychiatric/Behavioral:  Negative for behavioral problems and confusion.    Physical Exam Updated Vital Signs BP 138/77 (BP Location: Left Arm)   Pulse 78   Temp 99.4 F (37.4 C) (Oral)   Resp 20   Ht 5' 2.5" (1.588 m)   Wt 65.5 kg   LMP 09/30/2011   SpO2 98%   BMI 25.97 kg/m   Physical Exam Constitutional:      General: She is not in acute distress.    Appearance: Normal appearance. She is not ill-appearing, toxic-appearing or diaphoretic.  Cardiovascular:     Rate and Rhythm: Normal rate and regular rhythm.     Pulses: Normal pulses.  Pulmonary:     Effort: Pulmonary effort is normal.     Breath sounds: Normal breath sounds.   Musculoskeletal:        General: Normal range of motion.       Feet:  Feet:     Comments: Fourth and fifth digits with tenderness to palpation, able to flex and extend all digits and toes.  No tenderness to midfoot.  Normal range of motion to ankle.  Good sensation.  DP pulse 2+. Skin:    General: Skin is warm and dry.     Capillary Refill: Capillary refill takes less than 2 seconds.  Neurological:     General: No focal deficit present.     Mental Status: She is alert and oriented to person, place, and time.  Psychiatric:        Mood and Affect: Mood normal.        Behavior: Behavior normal.        Thought Content: Thought content normal.    ED Results / Procedures / Treatments   Labs (all labs ordered are listed, but only abnormal results are displayed) Labs Reviewed - No data to display  EKG None  Radiology DG Foot Complete Left  Result Date: 09/14/2020 CLINICAL DATA:  Status post trauma to the fourth and fifth left digits. EXAM: LEFT FOOT - COMPLETE 3+ VIEW COMPARISON:  None. FINDINGS: There is no evidence of fracture or dislocation. There is no evidence of arthropathy or other focal bone abnormality. Soft tissues are unremarkable. IMPRESSION: Negative. Electronically Signed   By: Virgina Norfolk M.D.   On: 09/14/2020 23:02    Procedures Procedures   Medications Ordered in ED Medications  acetaminophen (TYLENOL) tablet 650 mg (650 mg Oral Given 09/14/20 2250)    ED Course  I have reviewed the triage vital signs and the nursing notes.  Pertinent labs & imaging results that were available during my care of the patient were reviewed by me and considered in my medical decision making (see chart for details).    MDM Rules/Calculators/A&P                           Patient presents to the emerge apartment today for toe injury.  Patient is distally neurovascularly intact.  Plain films negative for acute fracture.  Patient is able to ambulate.  Tylenol given here today.   Postop shoe applied per patient's request.  Patient be  discharged home follow-up with PCP.  Doubt need for further emergent work up at this time. I explained the diagnosis and have given explicit precautions to return to the ER including for any other new or worsening symptoms. The patient understands and accepts the medical plan as it's been dictated and I have answered their questions. Discharge instructions concerning home care and prescriptions have been given. The patient is STABLE and is discharged to home in good condition.  Final Clinical Impression(s) / ED Diagnoses Final diagnoses:  Injury of toe on left foot, initial encounter    Rx / DC Orders ED Discharge Orders     None        Alfredia Client, PA-C 09/14/20 2325    Blanchie Dessert, MD 09/15/20 [phone]

## 2020-09-14 NOTE — Discharge Instructions (Signed)
Please read and follow all provided instructions.  You have been seen today for toe injury.   Tests performed today include: An x-ray of the affected area - does NOT show any broken bones or dislocations.  Vital signs. See below for your results today.   Home care instructions: -- *PRICE in the first 24-48 hours after injury: Protect (with brace, splint, sling), if given by your provider Rest Ice- Do not apply ice pack directly to your skin, place towel or similar between your skin and ice/ice pack. Apply ice for 20 min, then remove for 40 min while awake Compression- Wear brace, elastic bandage, splint as directed by your provider Elevate affected extremity above the level of your heart when not walking around for the first 24-48 hours   Use Ibuprofen (Motrin/Advil) '600mg'$  every 6 hours as needed for pain (do not exceed max dose in 24 hours, '2400mg'$ )  Follow-up instructions: Please follow-up with your primary care provider or the provided orthopedic physician (bone specialist) if you continue to have significant pain in 1 week. In this case you may have a more severe injury that requires further care.   Return instructions:  Please return if your toes or feet are numb or tingling, appear gray or blue, or you have severe pain (also elevate the leg and loosen splint or wrap if you were given one) Please return to the Emergency Department if you experience worsening symptoms.  Please return if you have any other emergent concerns. Additional Information:  Your vital signs today were: BP 138/77 (BP Location: Left Arm)   Pulse 78   Temp 99.4 F (37.4 C) (Oral)   Resp 20   Ht 5' 2.5" (1.588 m)   Wt 65.5 kg   LMP 09/30/2011   SpO2 98%   BMI 25.97 kg/m  If your blood pressure (BP) was elevated above 135/85 this visit, please have this repeated by your doctor within one month. ---------------

## 2020-10-06 ENCOUNTER — Other Ambulatory Visit: Payer: Self-pay

## 2020-10-06 ENCOUNTER — Emergency Department (HOSPITAL_COMMUNITY)
Admission: EM | Admit: 2020-10-06 | Discharge: 2020-10-06 | Disposition: A | Discharge status: Home / Self Care | Payer: 59 | Attending: Emergency Medicine | Admitting: Emergency Medicine

## 2020-10-06 DIAGNOSIS — Y92410 Unspecified street and highway as the place of occurrence of the external cause: Secondary | ICD-10-CM | POA: Diagnosis not present

## 2020-10-06 DIAGNOSIS — M545 Low back pain, unspecified: Secondary | ICD-10-CM | POA: Insufficient documentation

## 2020-10-06 DIAGNOSIS — S161XXA Strain of muscle, fascia and tendon at neck level, initial encounter: Secondary | ICD-10-CM

## 2020-10-06 DIAGNOSIS — M542 Cervicalgia: Secondary | ICD-10-CM | POA: Insufficient documentation

## 2020-10-06 DIAGNOSIS — S39012A Strain of muscle, fascia and tendon of lower back, initial encounter: Secondary | ICD-10-CM

## 2020-10-06 MED ORDER — CYCLOBENZAPRINE HCL 10 MG PO TABS: 10.0000 mg | ORAL_TABLET | Freq: Two times a day (BID) | ORAL | 0 refills | Status: AC | PRN

## 2020-10-06 NOTE — Discharge Instructions (Addendum)
Please return if your pain is unresolved after 2 weeks.
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
## 2021-01-03 ENCOUNTER — Other Ambulatory Visit: Payer: Self-pay | Admitting: Internal Medicine

## 2021-01-03 ENCOUNTER — Other Ambulatory Visit: Payer: Self-pay

## 2021-01-03 ENCOUNTER — Ambulatory Visit
Admission: RE | Admit: 2021-01-03 | Discharge: 2021-01-03 | Disposition: A | Discharge status: Home / Self Care | Payer: Self-pay | Source: Ambulatory Visit | Attending: Internal Medicine | Admitting: Internal Medicine

## 2021-01-03 DIAGNOSIS — Z1231 Encounter for screening mammogram for malignant neoplasm of breast: Secondary | ICD-10-CM
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
## 2021-12-30 IMAGING — MG MM DIGITAL SCREENING BILAT W/ TOMO AND CAD
8 series · 9 of 24 positions shown · non-contrast
Comparison: None.

CLINICAL DATA: Screening.

EXAM:
DIGITAL SCREENING BILATERAL MAMMOGRAM WITH TOMOSYNTHESIS AND CAD
TECHNIQUE: Bilateral screening digital craniocaudal and mediolateral oblique
mammograms were obtained. Bilateral screening digital breast
tomosynthesis was performed. The images were evaluated with
computer-aided detection.

[L CC synth-2D]
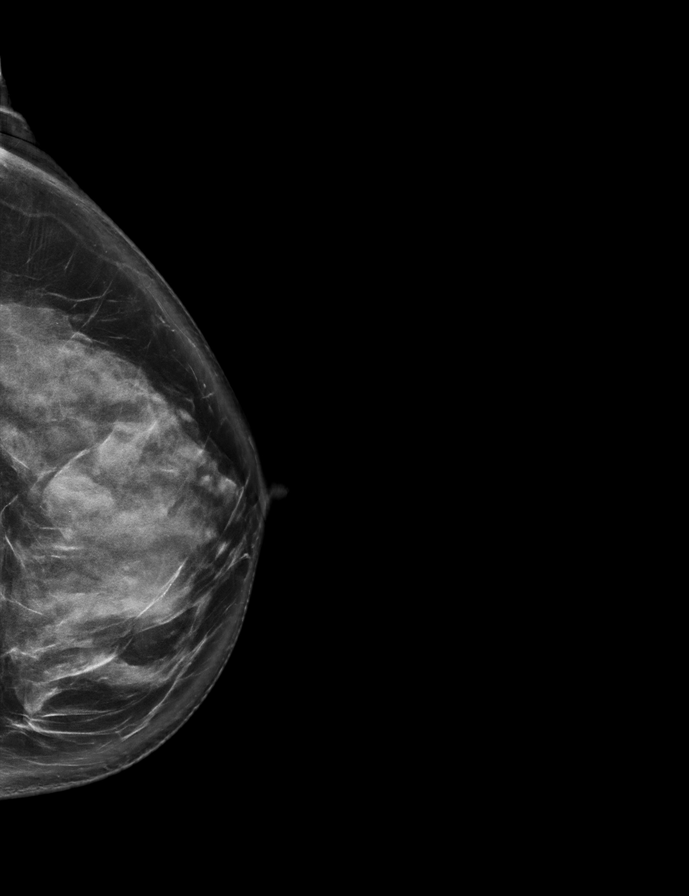

[R CC synth-2D]
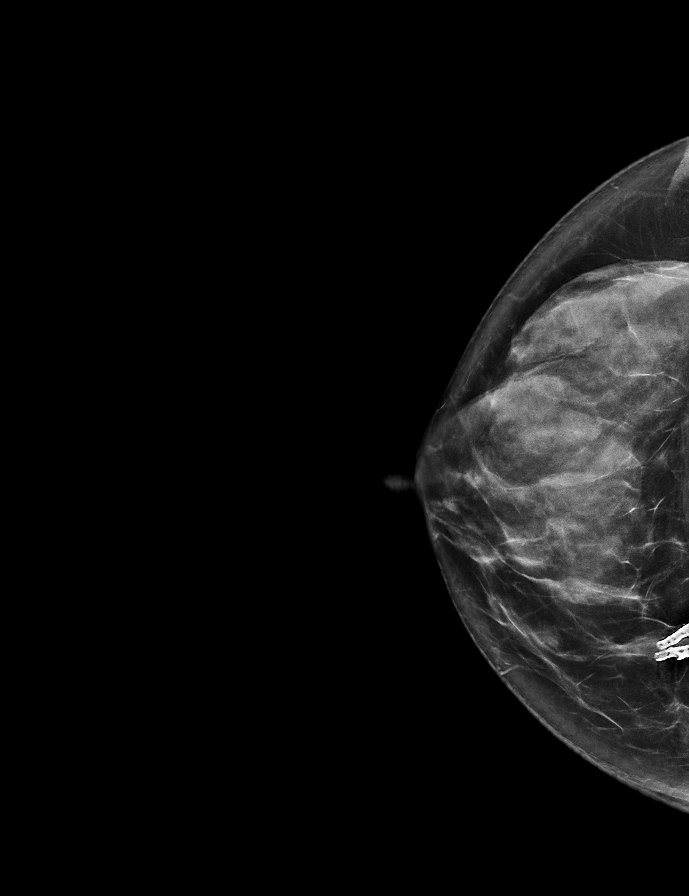

[R MLO synth-2D]
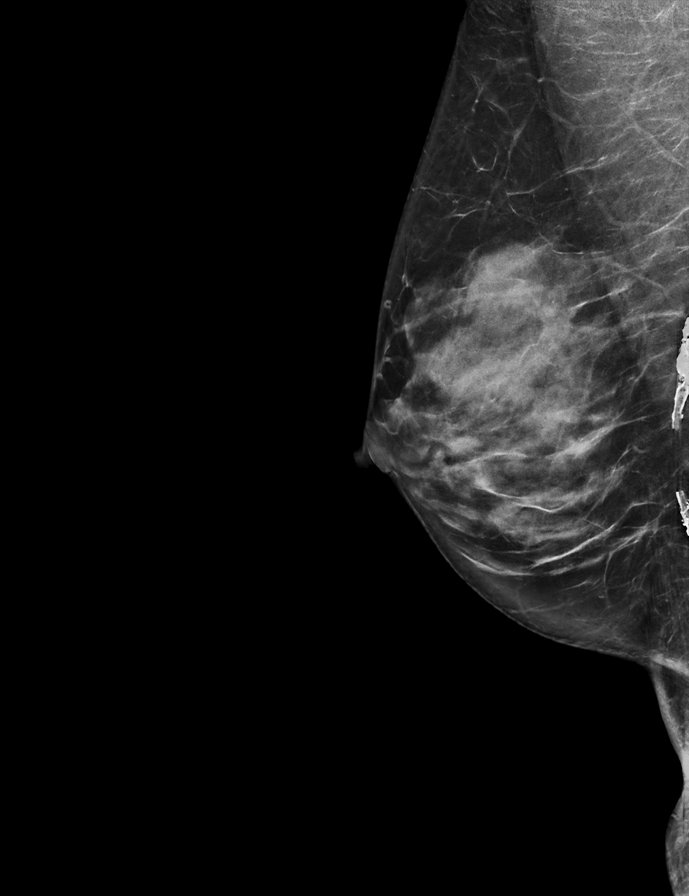

[L MLO synth-2D]
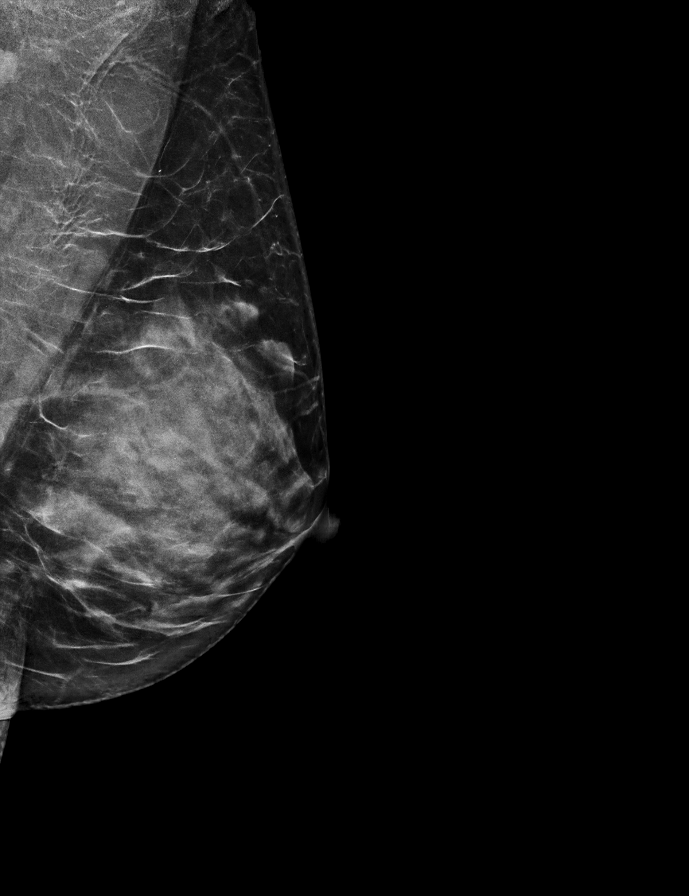

[R CC tomo · 2 of 69 frames shown]
[frame 23/69]
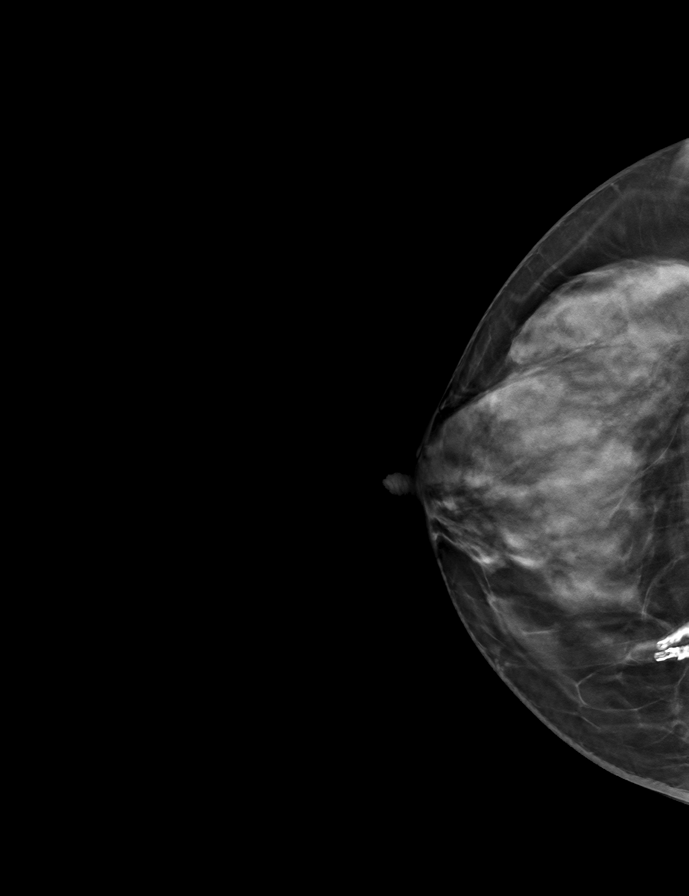
[frame 35/69]
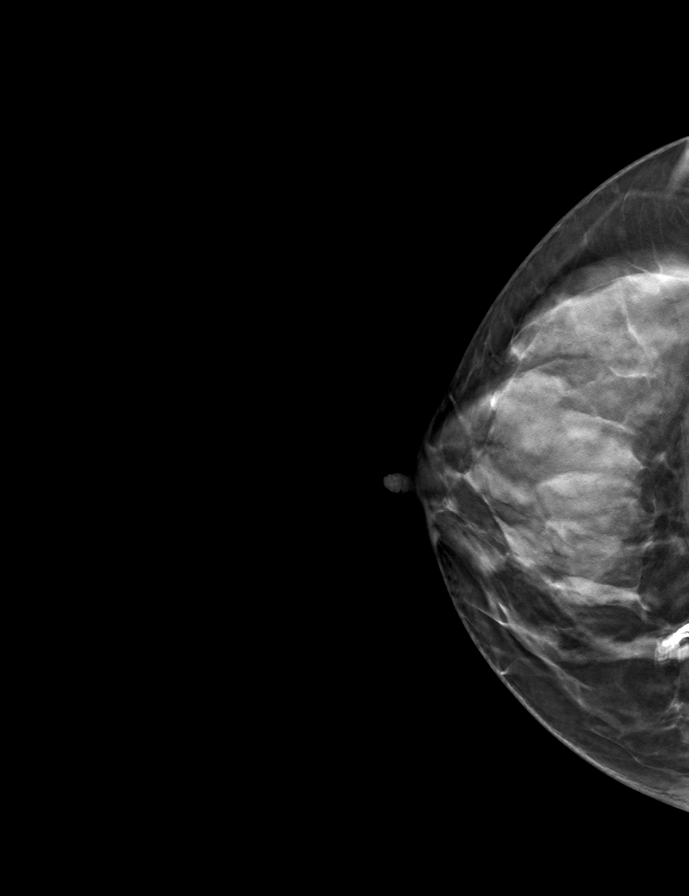

[R MLO tomo · tomo slice 35/69.0]
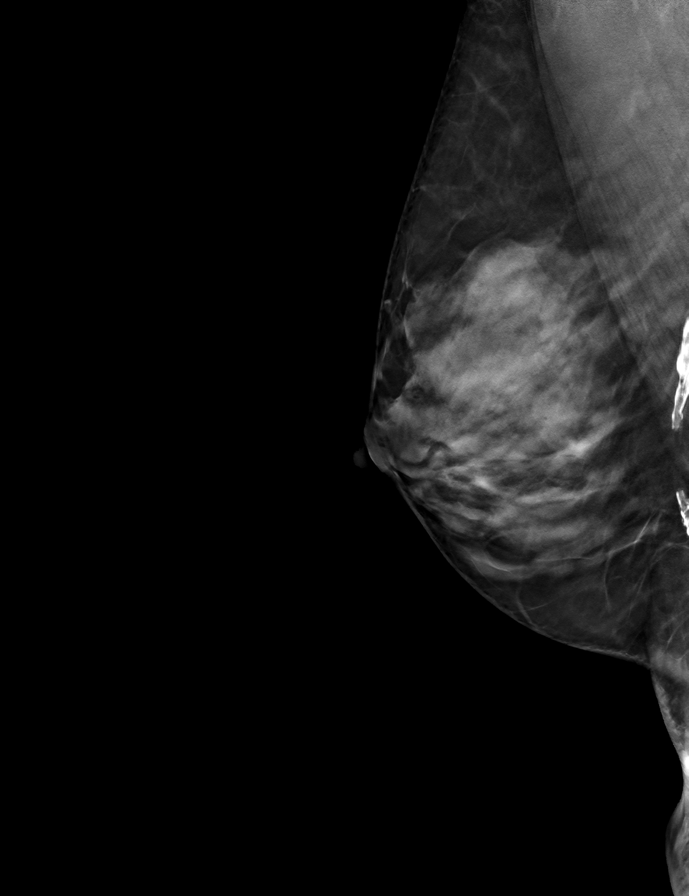

[L MLO tomo · tomo slice 34/67.0]
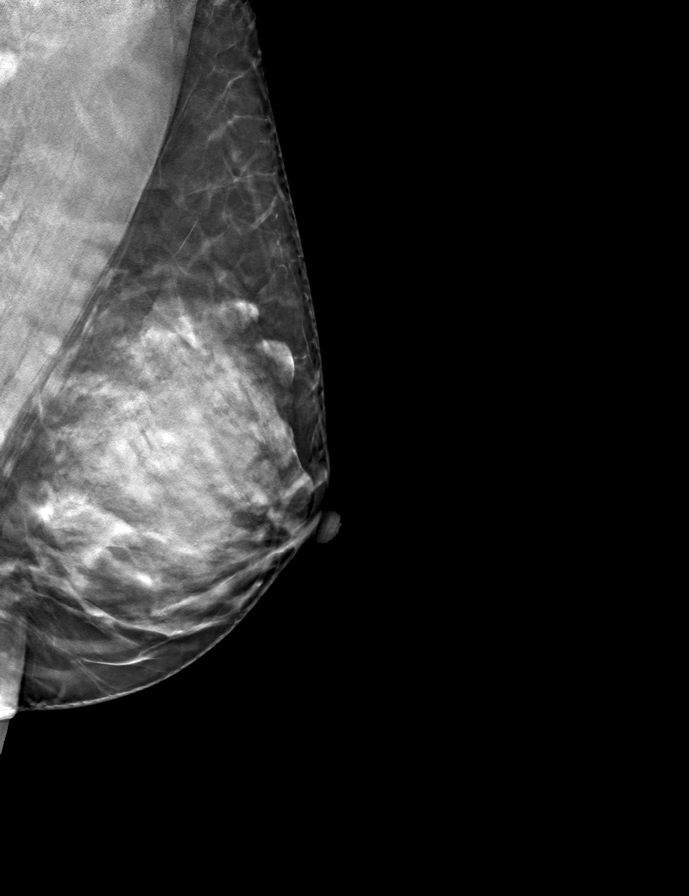

[L CC tomo · tomo slice 37/73.0]
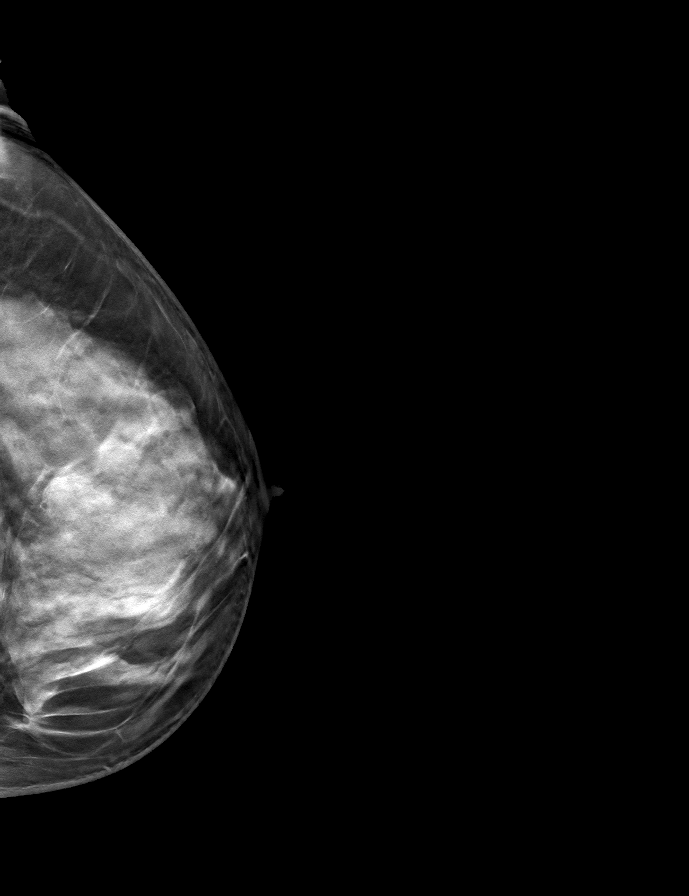

[9 of 24 positions shown; findings below may reference images not displayed]

ACR Breast Density Category d: The breast tissue is extremely dense,
which lowers the sensitivity of mammography.
FINDINGS: There are no findings suspicious for malignancy.
IMPRESSION: No mammographic evidence of malignancy. A result letter of this
screening mammogram will be mailed directly to the patient.

RECOMMENDATION:
Screening mammogram in one year. (Code:W3-Z-XIN)

BI-RADS CATEGORY  1: Negative.

## 2023-08-03 ENCOUNTER — Other Ambulatory Visit: Payer: Self-pay | Admitting: Nurse Practitioner

## 2023-08-03 ENCOUNTER — Ambulatory Visit
Admission: RE | Admit: 2023-08-03 | Discharge: 2023-08-03 | Disposition: A | Discharge status: Home / Self Care | Source: Ambulatory Visit | Attending: Nurse Practitioner | Admitting: Nurse Practitioner

## 2023-08-03 DIAGNOSIS — Z021 Encounter for pre-employment examination: Secondary | ICD-10-CM
